# Patient Record
Sex: Male | Born: 1980 | Race: Black or African American | Hispanic: No | Marital: Single | Smoking: Current every day smoker
Health system: Southern US, Community
[De-identification: ages and names within clinical notes are randomized; demographics above are authoritative.]

## PROBLEM LIST (undated history)

## (undated) DIAGNOSIS — I10 Essential (primary) hypertension: Secondary | ICD-10-CM

## (undated) HISTORY — PX: FRACTURE SURGERY: SHX138

---

## 2005-11-03 ENCOUNTER — Emergency Department: Payer: Self-pay | Admitting: Internal Medicine

## 2008-07-25 ENCOUNTER — Emergency Department: Payer: Self-pay | Admitting: Emergency Medicine

## 2008-08-02 ENCOUNTER — Emergency Department: Payer: Self-pay | Admitting: Internal Medicine

## 2015-07-03 ENCOUNTER — Emergency Department
Admission: EM | Admit: 2015-07-03 | Discharge: 2015-07-03 | Disposition: A | Discharge status: Home / Self Care | Payer: BLUE CROSS/BLUE SHIELD | Attending: Emergency Medicine | Admitting: Emergency Medicine

## 2015-07-03 ENCOUNTER — Emergency Department: Payer: BLUE CROSS/BLUE SHIELD

## 2015-07-03 ENCOUNTER — Encounter: Payer: Self-pay | Admitting: Emergency Medicine

## 2015-07-03 DIAGNOSIS — F17210 Nicotine dependence, cigarettes, uncomplicated: Secondary | ICD-10-CM | POA: Insufficient documentation

## 2015-07-03 DIAGNOSIS — M545 Low back pain, unspecified: Secondary | ICD-10-CM

## 2015-07-03 MED ORDER — DIAZEPAM 5 MG PO TABS
5.0000 mg | ORAL_TABLET | Freq: Once | ORAL | Status: AC
  Administered 2015-07-03: 5 mg via ORAL
  Filled 2015-07-03: qty 1

## 2015-07-03 MED ORDER — DIAZEPAM 2 MG PO TABS: 2.0000 mg | ORAL_TABLET | Freq: Three times a day (TID) | ORAL | Status: AC | PRN

## 2015-07-03 MED ORDER — PREDNISONE 10 MG (21) PO TBPK: ORAL_TABLET | ORAL | Status: DC

## 2015-07-03 MED ORDER — KETOROLAC TROMETHAMINE 10 MG PO TABS: 10.0000 mg | ORAL_TABLET | Freq: Four times a day (QID) | ORAL | Status: DC | PRN

## 2015-07-03 MED ORDER — ORPHENADRINE CITRATE 30 MG/ML IJ SOLN: 60.0000 mg | Freq: Two times a day (BID) | INTRAMUSCULAR | Status: DC

## 2015-07-03 MED ORDER — KETOROLAC TROMETHAMINE 60 MG/2ML IM SOLN
60.0000 mg | Freq: Once | INTRAMUSCULAR | Status: AC
  Administered 2015-07-03: 60 mg via INTRAMUSCULAR
  Filled 2015-07-03: qty 2

## 2015-07-03 NOTE — Discharge Instructions (Signed)

## 2015-07-03 NOTE — ED Provider Notes (Signed)
[facility] Emergency Department Provider Note ____________________________________________  Time seen: Approximately 8:26 PM  I have reviewed the triage vital signs and the nursing notes.   HISTORY  Chief Complaint Back Pain   HPI Danny Weeks is a 34 y.o. male who presents to the emergency department for evaluation of lower back pain. Pain started about a month ago during a football game. Over the past 2 weeks, the pain has gone from intermittent to continuous. No relief with muscle relaxants, ibuprofen, or Norco. No history of back injury prior to this. No loss of bowel or bladder control. No saddle paresthesia.  History reviewed. No pertinent past medical history.  There are no active problems to display for this patient.   History reviewed. No pertinent past surgical history.  Current Outpatient Rx  Name  Route  Sig  Dispense  Refill  . diazepam (VALIUM) 2 MG tablet   Oral   Take 1 tablet (2 mg total) by mouth every 8 (eight) hours as needed for anxiety. DO NOT TAKE AND DRIVE MACHINERY   12 tablet   0   . ketorolac (TORADOL) 10 MG tablet   Oral   Take 1 tablet (10 mg total) by mouth every 6 (six) hours as needed.   20 tablet   0   . predniSONE (STERAPRED UNI-PAK 21 TAB) 10 MG (21) TBPK tablet      Take 6 tablets on day 1 Take 5 tablets on day 2 Take 4 tablets on day 3 Take 3 tablets on day 4 Take 2 tablets on day 5 Take 1 tablet on day 6   21 tablet   0     Allergies Review of patient's allergies indicates no known allergies.  No family history on file.  Social History Social History  Substance Use Topics  . Smoking status: Current Every Day Smoker    Types: Cigarettes  . Smokeless tobacco: Never Used  . Alcohol Use: Yes    Review of Systems Constitutional: No recent illness. Eyes: No visual changes. ENT: No sore throat. Cardiovascular: Denies chest pain or palpitations. Respiratory: Denies shortness of  breath. Gastrointestinal: No abdominal pain.  Genitourinary: Negative for dysuria. Musculoskeletal: Pain in lower back with radiation into the left buttock. Skin: Negative for rash. Neurological: Negative for headaches, focal weakness or numbness. 10-point ROS otherwise negative.  ____________________________________________   PHYSICAL EXAM:  VITAL SIGNS: ED Triage Vitals  Enc Vitals Group     BP 07/03/15 2008 175/112 mmHg     Pulse Rate 07/03/15 2008 97     Resp 07/03/15 2008 16     Temp 07/03/15 2008 98.2 F (36.8 C)     Temp Source 07/03/15 2008 Oral     SpO2 07/03/15 2008 97 %     Weight 07/03/15 2008 270 lb (122.471 kg)     Height 07/03/15 2008 6\' 3"  (1.905 m)     Head Cir --      Peak Flow --      Pain Score 07/03/15 2008 9     Pain Loc --      Pain Edu? --      Excl. in GC? --     Constitutional: Alert and oriented. Well appearing and in no acute distress. Eyes: Conjunctivae are normal. EOMI. Head: Atraumatic. Nose: No congestion/rhinnorhea. Neck: No stridor.  Respiratory: Normal respiratory effort.   Musculoskeletal: Limited flexion from the waist due to pain.  Neurologic:  Normal speech and language. No gross focal neurologic  deficits are appreciated. Speech is normal. No gait instability. Skin:  Skin is warm, dry and intact. Atraumatic. Psychiatric: Mood and affect are normal. Speech and behavior are normal.  ____________________________________________   LABS (all labs ordered are listed, but only abnormal results are displayed)  Labs Reviewed - No data to display ____________________________________________  RADIOLOGY  Lumbar spine film negative for acute abnormality per radiology. ____________________________________________   PROCEDURES  Procedure(s) performed:    ____________________________________________   INITIAL IMPRESSION / ASSESSMENT AND PLAN / ED COURSE  Pertinent labs & imaging results that were available during my care of  the patient were reviewed by me and considered in my medical decision making (see chart for details).  Patient was given Toradol IM and Valium by mouth while in the emergency department with moderate relief. He will be prescribed both and advised to follow-up with orthopedics for symptoms that are not improving over the next few days. He was advised to return to the emergency department for symptoms that change or worsen if he is unable to schedule an appointment. ____________________________________________   FINAL CLINICAL IMPRESSION(S) / ED DIAGNOSES  Final diagnoses:  Acute lumbar back pain       Chinita Pester, FNP 07/03/15 2307  Emily Filbert, MD 07/03/15 [phone]

## 2015-07-03 NOTE — ED Notes (Signed)
Pt presents with low back pain for two weeks.  

## 2016-10-01 ENCOUNTER — Encounter: Payer: Self-pay | Admitting: *Deleted

## 2016-10-01 ENCOUNTER — Emergency Department
Admission: EM | Admit: 2016-10-01 | Discharge: 2016-10-01 | Disposition: A | Discharge status: Home / Self Care | Payer: BLUE CROSS/BLUE SHIELD | Attending: Emergency Medicine | Admitting: Emergency Medicine

## 2016-10-01 DIAGNOSIS — F17210 Nicotine dependence, cigarettes, uncomplicated: Secondary | ICD-10-CM | POA: Insufficient documentation

## 2016-10-01 DIAGNOSIS — M545 Low back pain, unspecified: Secondary | ICD-10-CM

## 2016-10-01 DIAGNOSIS — X501XXA Overexertion from prolonged static or awkward postures, initial encounter: Secondary | ICD-10-CM | POA: Insufficient documentation

## 2016-10-01 DIAGNOSIS — Y999 Unspecified external cause status: Secondary | ICD-10-CM | POA: Insufficient documentation

## 2016-10-01 DIAGNOSIS — Y929 Unspecified place or not applicable: Secondary | ICD-10-CM | POA: Insufficient documentation

## 2016-10-01 DIAGNOSIS — Y9389 Activity, other specified: Secondary | ICD-10-CM | POA: Insufficient documentation

## 2016-10-01 MED ORDER — ORPHENADRINE CITRATE 30 MG/ML IJ SOLN
60.0000 mg | Freq: Two times a day (BID) | INTRAMUSCULAR | Status: DC
  Administered 2016-10-01: 60 mg via INTRAMUSCULAR
  Filled 2016-10-01: qty 2

## 2016-10-01 MED ORDER — CYCLOBENZAPRINE HCL 5 MG PO TABS
5.0000 mg | ORAL_TABLET | Freq: Three times a day (TID) | ORAL | 0 refills | Status: AC | PRN
Start: 2016-10-01 — End: 2016-10-04

## 2016-10-01 NOTE — ED Triage Notes (Signed)
Pt stood up from toilet felt pull in lower back, pt denies any other  symptoms

## 2016-10-01 NOTE — ED Provider Notes (Signed)
[facility] Emergency Department Provider Note  ____________________________________________  Time seen: Approximately 9:05 PM  I have reviewed the triage vital signs and the nursing notes.   HISTORY  Chief Complaint Back Pain    HPI Danny Weeks is a 36 y.o. male presenting to the emergency department with low back pain. Patient states that he "stood up from the toilet" and felt a pop. Patient characterizes low back pain at 8 out of 10 in intensity and aching. He denies radiculopathy, weakness or bowel or bladder incontinence. Patient has taken ibuprofen but has attempted no other alleviating measures. He denies dysuria, hematuria and bilateral flank pain.   History reviewed. No pertinent past medical history.  There are no active problems to display for this patient.   History reviewed. No pertinent surgical history.  Prior to Admission medications   Medication Sig Start Date End Date Taking? Authorizing Provider  ketorolac (TORADOL) 10 MG tablet Take 1 tablet (10 mg total) by mouth every 6 (six) hours as needed. 07/03/15   Chinita Pesterari B Triplett, FNP  predniSONE (STERAPRED UNI-PAK 21 TAB) 10 MG (21) TBPK tablet Take 6 tablets on day 1 Take 5 tablets on day 2 Take 4 tablets on day 3 Take 3 tablets on day 4 Take 2 tablets on day 5 Take 1 tablet on day 6 07/03/15   Chinita Pesterari B Triplett, FNP    Allergies Patient has no known allergies.  No family history on file.  Social History Social History  Substance Use Topics  . Smoking status: Current Every Day Smoker    Packs/day: 1.00    Types: Cigarettes  . Smokeless tobacco: Never Used  . Alcohol use Yes    Review of Systems  Constitutional: No fever/chills Eyes: No visual changes. No discharge ENT: No upper respiratory complaints. Cardiovascular: no chest pain. Respiratory: no cough. No SOB. Gastrointestinal: No abdominal pain. No nausea, no vomiting.  No diarrhea.  No constipation. Genitourinary:  Negative for dysuria. No hematuria Musculoskeletal: Patient has low back pain.  Skin: Negative for rash, abrasions, lacerations, ecchymosis. Neurological: Negative for headaches, focal weakness or numbness. ____________________________________________   PHYSICAL EXAM:  VITAL SIGNS: ED Triage Vitals  Enc Vitals Group     BP 10/01/16 1842 (!) 170/88     Pulse Rate 10/01/16 1842 100     Resp 10/01/16 1842 18     Temp 10/01/16 1842 97.5 F (36.4 C)     Temp src --      SpO2 10/01/16 1842 98 %     Weight 10/01/16 1841 273 lb (123.8 kg)     Height 10/01/16 1841 6\' 3"  (1.905 m)     Head Circumference --      Peak Flow --      Pain Score 10/01/16 1841 8     Pain Loc --      Pain Edu? --      Excl. in GC? --      Constitutional: Alert and oriented. Well appearing and in no acute distress. Eyes: Conjunctivae are normal. PERRL. EOMI. Head: Atraumatic.  Neck: Full range of motion. No pain was elicited with palpation of the cervical spine. No radiculopathy was elicited with range of motion testing.  Cardiovascular: Normal rate, regular rhythm. Normal S1 and S2.  Good peripheral circulation. Respiratory: Normal respiratory effort without tachypnea or retractions. Lungs CTAB. Good air entry to the bases with no decreased or absent breath sounds. Musculoskeletal: Patient has 5/5 strength in the upper and lower extremities  bilaterally. Full range of motion at the shoulder, elbow and wrist bilaterally. Full range of motion at the hip, knee and ankle bilaterally. No changes in gait.  Patient's low back pain is intensified with  extension at the spine. Negative straight leg raise test bilaterally. No pain was elicited with palpation along the lumbar and thoracic spine regions. Possible radial and ulnar pulses bilaterally and symmetrically. Neurologic:  Normal speech and language. No gross focal neurologic deficits are appreciated. Reflexes are 2+ and symmetric in the upper and lower extremities  bilaterally. Skin:  Skin is warm, dry and intact. No rash noted. Psychiatric: Mood and affect are normal. Speech and behavior are normal. Patient exhibits appropriate insight and judgement. ____________________________________________   LABS (all labs ordered are listed, but only abnormal results are displayed)  Labs Reviewed - No data to display ____________________________________________  EKG   ____________________________________________  RADIOLOGY   No results found.  ____________________________________________    PROCEDURES  Procedure(s) performed:    Procedures    Medications - No data to display   ____________________________________________   INITIAL IMPRESSION / ASSESSMENT AND PLAN / ED COURSE  Pertinent labs & imaging results that were available during my care of the patient were reviewed by me and considered in my medical decision making (see chart for details).  Review of the [place] CSRS was performed in accordance of the [facility] prior to dispensing any controlled drugs.     Assessment and Plan:  Low Back Pain  Patient presents to the emergency department with low back pain. Patient denies radiculopathy, weakness or bowel or bladder incontinence. An injection of Norflex was given in the emergency department. Patient was discharged with Flexeril. Physical exam is reassuring at this time. Patient was advised to follow-up with his primary care provider as needed for low back pain. All patient questions were answered.     ____________________________________________  FINAL CLINICAL IMPRESSION(S) / ED DIAGNOSES  Final diagnoses:  None      NEW MEDICATIONS STARTED DURING THIS VISIT:  New Prescriptions   No medications on file        This chart was dictated using voice recognition software/Dragon. Despite best efforts to proofread, errors can occur which can change the meaning. Any change was purely unintentional.    Orvil Feil,  PA-C 10/02/16 0025    Orvil Feil, PA-C 10/02/16 0025    Merrily Brittle, MD 10/02/16 1252

## 2017-06-20 ENCOUNTER — Encounter: Payer: Self-pay | Admitting: Radiology

## 2017-06-20 ENCOUNTER — Emergency Department: Payer: Self-pay

## 2017-06-20 ENCOUNTER — Other Ambulatory Visit: Payer: Self-pay

## 2017-06-20 ENCOUNTER — Observation Stay
Admission: EM | Admit: 2017-06-20 | Discharge: 2017-06-23 | Disposition: A | Discharge status: Home / Self Care | Payer: Self-pay | Attending: Internal Medicine | Admitting: Internal Medicine

## 2017-06-20 DIAGNOSIS — Z79899 Other long term (current) drug therapy: Secondary | ICD-10-CM | POA: Insufficient documentation

## 2017-06-20 DIAGNOSIS — I10 Essential (primary) hypertension: Secondary | ICD-10-CM | POA: Insufficient documentation

## 2017-06-20 DIAGNOSIS — R000 Tachycardia, unspecified: Secondary | ICD-10-CM | POA: Insufficient documentation

## 2017-06-20 DIAGNOSIS — R509 Fever, unspecified: Secondary | ICD-10-CM | POA: Insufficient documentation

## 2017-06-20 DIAGNOSIS — F17210 Nicotine dependence, cigarettes, uncomplicated: Secondary | ICD-10-CM | POA: Insufficient documentation

## 2017-06-20 DIAGNOSIS — R197 Diarrhea, unspecified: Secondary | ICD-10-CM | POA: Insufficient documentation

## 2017-06-20 DIAGNOSIS — E860 Dehydration: Secondary | ICD-10-CM | POA: Insufficient documentation

## 2017-06-20 DIAGNOSIS — R1030 Lower abdominal pain, unspecified: Secondary | ICD-10-CM | POA: Insufficient documentation

## 2017-06-20 DIAGNOSIS — A021 Salmonella sepsis: Principal | ICD-10-CM | POA: Insufficient documentation

## 2017-06-20 DIAGNOSIS — E876 Hypokalemia: Secondary | ICD-10-CM | POA: Insufficient documentation

## 2017-06-20 DIAGNOSIS — R109 Unspecified abdominal pain: Secondary | ICD-10-CM

## 2017-06-20 DIAGNOSIS — R112 Nausea with vomiting, unspecified: Secondary | ICD-10-CM

## 2017-06-20 DIAGNOSIS — K219 Gastro-esophageal reflux disease without esophagitis: Secondary | ICD-10-CM | POA: Insufficient documentation

## 2017-06-20 LAB — CBC
HEMATOCRIT: 53.8 % — AB (ref 40.0–52.0)
Hemoglobin: 17.9 g/dL (ref 13.0–18.0)
MCH: 29.7 pg (ref 26.0–34.0)
MCHC: 33.2 g/dL (ref 32.0–36.0)
MCV: 89.3 fL (ref 80.0–100.0)
Platelets: 298 10*3/uL (ref 150–440)
RBC: 6.02 MIL/uL — ABNORMAL HIGH (ref 4.40–5.90)
RDW: 14.3 % (ref 11.5–14.5)
WBC: 14.4 10*3/uL — ABNORMAL HIGH (ref 3.8–10.6)

## 2017-06-20 LAB — GASTROINTESTINAL PANEL BY PCR, STOOL (REPLACES STOOL CULTURE)
ADENOVIRUS F40/41: NOT DETECTED
ASTROVIRUS: NOT DETECTED
CYCLOSPORA CAYETANENSIS: NOT DETECTED
Campylobacter species: NOT DETECTED
Cryptosporidium: NOT DETECTED
ENTAMOEBA HISTOLYTICA: NOT DETECTED
ENTEROAGGREGATIVE E COLI (EAEC): NOT DETECTED
ENTEROTOXIGENIC E COLI (ETEC): NOT DETECTED
Enteropathogenic E coli (EPEC): NOT DETECTED
GIARDIA LAMBLIA: NOT DETECTED
NOROVIRUS GI/GII: NOT DETECTED
Plesimonas shigelloides: NOT DETECTED
Rotavirus A: NOT DETECTED
Salmonella species: DETECTED — AB
Sapovirus (I, II, IV, and V): NOT DETECTED
Shiga like toxin producing E coli (STEC): NOT DETECTED
Shigella/Enteroinvasive E coli (EIEC): NOT DETECTED
VIBRIO CHOLERAE: NOT DETECTED
VIBRIO SPECIES: NOT DETECTED
Yersinia enterocolitica: NOT DETECTED

## 2017-06-20 LAB — COMPREHENSIVE METABOLIC PANEL
ALT: 56 U/L (ref 17–63)
ANION GAP: 13 (ref 5–15)
AST: 41 U/L (ref 15–41)
Albumin: 4.7 g/dL (ref 3.5–5.0)
Alkaline Phosphatase: 59 U/L (ref 38–126)
BUN: 18 mg/dL (ref 6–20)
CHLORIDE: 97 mmol/L — AB (ref 101–111)
CO2: 24 mmol/L (ref 22–32)
Calcium: 9.8 mg/dL (ref 8.9–10.3)
Creatinine, Ser: 1.65 mg/dL — ABNORMAL HIGH (ref 0.61–1.24)
GFR, EST NON AFRICAN AMERICAN: 52 mL/min — AB (ref >60)
Glucose, Bld: 137 mg/dL — ABNORMAL HIGH (ref 65–99)
POTASSIUM: 3.5 mmol/L (ref 3.5–5.1)
Sodium: 134 mmol/L — ABNORMAL LOW (ref 135–145)
TOTAL PROTEIN: 9.2 g/dL — AB (ref 6.5–8.1)
Total Bilirubin: 1.5 mg/dL — ABNORMAL HIGH (ref 0.3–1.2)

## 2017-06-20 LAB — LACTIC ACID, PLASMA
LACTIC ACID, VENOUS: 2.5 mmol/L — AB (ref 0.5–1.9)
Lactic Acid, Venous: 1.3 mmol/L (ref 0.5–1.9)

## 2017-06-20 LAB — LIPASE, BLOOD: LIPASE: 18 U/L (ref 11–51)

## 2017-06-20 MED ORDER — CLONIDINE HCL 0.2 MG/24HR TD PTWK
0.2000 mg | MEDICATED_PATCH | TRANSDERMAL | Status: DC
  Administered 2017-06-20: 0.2 mg via TRANSDERMAL
  Filled 2017-06-20: qty 1

## 2017-06-20 MED ORDER — ACETAMINOPHEN 325 MG PO TABS
650.0000 mg | ORAL_TABLET | Freq: Four times a day (QID) | ORAL | Status: DC | PRN
  Administered 2017-06-20 – 2017-06-21 (×3): 650 mg via ORAL
  Filled 2017-06-20 (×3): qty 2

## 2017-06-20 MED ORDER — ONDANSETRON HCL 4 MG PO TABS: 4.0000 mg | ORAL_TABLET | Freq: Four times a day (QID) | ORAL | Status: DC | PRN

## 2017-06-20 MED ORDER — SODIUM CHLORIDE 0.9 % IV BOLUS (SEPSIS)
1000.0000 mL | Freq: Once | INTRAVENOUS | Status: AC
  Administered 2017-06-20: 1000 mL via INTRAVENOUS

## 2017-06-20 MED ORDER — TRAMADOL HCL 50 MG PO TABS
50.0000 mg | ORAL_TABLET | Freq: Four times a day (QID) | ORAL | Status: DC | PRN
  Administered 2017-06-22: 50 mg via ORAL
  Filled 2017-06-20: qty 1

## 2017-06-20 MED ORDER — POTASSIUM CHLORIDE IN NACL 20-0.9 MEQ/L-% IV SOLN
INTRAVENOUS | Status: DC
  Administered 2017-06-20 – 2017-06-23 (×7): via INTRAVENOUS
  Filled 2017-06-20 (×11): qty 1000

## 2017-06-20 MED ORDER — HYDRALAZINE HCL 20 MG/ML IJ SOLN
10.0000 mg | INTRAMUSCULAR | Status: DC | PRN
  Administered 2017-06-20 – 2017-06-21 (×2): 10 mg via INTRAVENOUS
  Filled 2017-06-20 (×2): qty 1

## 2017-06-20 MED ORDER — HYDROMORPHONE HCL 1 MG/ML IJ SOLN
1.0000 mg | INTRAMUSCULAR | Status: DC | PRN
  Administered 2017-06-20 – 2017-06-22 (×12): 1 mg via INTRAVENOUS
  Filled 2017-06-20 (×12): qty 1

## 2017-06-20 MED ORDER — METRONIDAZOLE IN NACL 5-0.79 MG/ML-% IV SOLN
500.0000 mg | Freq: Three times a day (TID) | INTRAVENOUS | Status: DC
  Administered 2017-06-20 – 2017-06-21 (×3): 500 mg via INTRAVENOUS
  Filled 2017-06-20 (×4): qty 100

## 2017-06-20 MED ORDER — MORPHINE SULFATE (PF) 4 MG/ML IV SOLN
4.0000 mg | Freq: Once | INTRAVENOUS | Status: AC
  Administered 2017-06-20: 4 mg via INTRAVENOUS
  Filled 2017-06-20: qty 1

## 2017-06-20 MED ORDER — HEPARIN SODIUM (PORCINE) 5000 UNIT/ML IJ SOLN
5000.0000 [IU] | Freq: Three times a day (TID) | INTRAMUSCULAR | Status: DC
  Administered 2017-06-20 – 2017-06-23 (×8): 5000 [IU] via SUBCUTANEOUS
  Filled 2017-06-20 (×8): qty 1

## 2017-06-20 MED ORDER — PB-HYOSCY-ATROPINE-SCOPOLAMINE 16.2 MG/5ML PO ELIX
10.0000 mL | ORAL_SOLUTION | Freq: Once | ORAL | Status: AC
  Administered 2017-06-20: 32.4 mg via ORAL
  Filled 2017-06-20: qty 10

## 2017-06-20 MED ORDER — KETOROLAC TROMETHAMINE 30 MG/ML IJ SOLN
30.0000 mg | Freq: Once | INTRAMUSCULAR | Status: AC
  Administered 2017-06-20: 30 mg via INTRAVENOUS
  Filled 2017-06-20: qty 1

## 2017-06-20 MED ORDER — ONDANSETRON HCL 4 MG/2ML IJ SOLN
4.0000 mg | Freq: Once | INTRAMUSCULAR | Status: AC
  Administered 2017-06-20: 4 mg via INTRAVENOUS
  Filled 2017-06-20: qty 2

## 2017-06-20 MED ORDER — MORPHINE SULFATE (PF) 2 MG/ML IV SOLN: 2.0000 mg | INTRAVENOUS | Status: DC | PRN

## 2017-06-20 MED ORDER — ONDANSETRON HCL 4 MG/2ML IJ SOLN
4.0000 mg | Freq: Four times a day (QID) | INTRAMUSCULAR | Status: DC | PRN
  Administered 2017-06-20: 4 mg via INTRAVENOUS
  Filled 2017-06-20: qty 2

## 2017-06-20 MED ORDER — ACETAMINOPHEN 650 MG RE SUPP: 650.0000 mg | Freq: Four times a day (QID) | RECTAL | Status: DC | PRN

## 2017-06-20 MED ORDER — IOPAMIDOL (ISOVUE-300) INJECTION 61%
100.0000 mL | Freq: Once | INTRAVENOUS | Status: AC | PRN
  Administered 2017-06-20: 100 mL via INTRAVENOUS

## 2017-06-20 MED ORDER — IOPAMIDOL (ISOVUE-300) INJECTION 61%
30.0000 mL | Freq: Once | INTRAVENOUS | Status: AC | PRN
  Administered 2017-06-20: 30 mL via ORAL

## 2017-06-20 MED ORDER — BISACODYL 10 MG RE SUPP: 10.0000 mg | Freq: Every day | RECTAL | Status: DC | PRN

## 2017-06-20 MED ORDER — CIPROFLOXACIN IN D5W 400 MG/200ML IV SOLN
400.0000 mg | Freq: Two times a day (BID) | INTRAVENOUS | Status: DC
  Administered 2017-06-20 – 2017-06-23 (×6): 400 mg via INTRAVENOUS
  Filled 2017-06-20 (×7): qty 200

## 2017-06-20 MED ORDER — PANTOPRAZOLE SODIUM 40 MG IV SOLR
40.0000 mg | Freq: Two times a day (BID) | INTRAVENOUS | Status: DC
Start: 2017-06-20 — End: 2017-06-23
  Administered 2017-06-20 – 2017-06-22 (×5): 40 mg via INTRAVENOUS
  Filled 2017-06-20 (×6): qty 40

## 2017-06-20 NOTE — ED Provider Notes (Addendum)
[facility] Emergency Department Provider Note ____________________________________________   First MD Initiated Contact with Patient 06/20/17 0945     (approximate)  I have reviewed the triage vital signs and the nursing notes.   HISTORY  Chief Complaint Abdominal Pain    HPI Danny Weeks is a 36 y.o. male with no significant past medical history who presents with lower abdominal crampy pain for the last day, associated with multiple episodes of nonbloody vomiting and nonbloody diarrhea, and generalized malaise.  Patient states that the pain comes in waves and is not constant.  Patient states that the symptoms started yesterday after eating smoked [place] the night before, and he thinks that the [place] was bad.  No sick contacts or other recent illness.  No recent travel or antibiotic use.   History reviewed. No pertinent past medical history.  There are no active problems to display for this patient.   No past surgical history on file.  Prior to Admission medications   Medication Sig Start Date End Date Taking? Authorizing Provider  ketorolac (TORADOL) 10 MG tablet Take 1 tablet (10 mg total) by mouth every 6 (six) hours as needed. 07/03/15   Triplett, Cari B, FNP  predniSONE (STERAPRED UNI-PAK 21 TAB) 10 MG (21) TBPK tablet Take 6 tablets on day 1 Take 5 tablets on day 2 Take 4 tablets on day 3 Take 3 tablets on day 4 Take 2 tablets on day 5 Take 1 tablet on day 6 07/03/15   Kem Boroughsriplett, Cari B, FNP    Allergies Patient has no known allergies.  No family history on file.  Social History Social History   Tobacco Use  . Smoking status: Current Every Day Smoker    Packs/day: 1.00    Types: Cigarettes  . Smokeless tobacco: Never Used  Substance Use Topics  . Alcohol use: Yes  . Drug use: No    Review of Systems  Constitutional: Positive for chills. Eyes: No icterus. ENT: No sore throat. Cardiovascular: Denies chest pain. Respiratory:  Denies shortness of breath. Gastrointestinal: Positive for nausea, vomiting, and diarrhea.  Genitourinary: Negative for dysuria.  Musculoskeletal: Negative for back pain. Skin: Negative for rash. Neurological: Negative for headache.    ____________________________________________   PHYSICAL EXAM:  VITAL SIGNS: ED Triage Vitals [06/20/17 0915]  Enc Vitals Group     BP      Pulse Rate (!) 138     Resp 20     Temp 99.9 F (37.7 C)     Temp src      SpO2 97 %     Weight 280 lb (127 kg)     Height 6\' 2"  (1.88 m)     Head Circumference      Peak Flow      Pain Score 10     Pain Loc      Pain Edu?      Excl. in GC?     Constitutional: Alert and oriented.  Slightly uncomfortable appearing but in no acute distress. Eyes: Conjunctivae are normal.  No scleral icterus  Head: Atraumatic. Nose: No congestion/rhinnorhea. Mouth/Throat: Mucous membranes are slightly dry.   Neck: Normal range of motion.  Cardiovascular:   Good peripheral circulation. Respiratory: Normal respiratory effort.  No retractions. Gastrointestinal: Soft and nontender. No distention.  Genitourinary: No flank tenderness. Musculoskeletal: Extremities warm and well perfused.  Neurologic:  Normal speech and language. No gross focal neurologic deficits are appreciated.  Skin:  Skin is warm and dry. No  rash noted. Psychiatric: Mood and affect are normal. Speech and behavior are normal.  ____________________________________________   LABS (all labs ordered are listed, but only abnormal results are displayed)  Labs Reviewed  COMPREHENSIVE METABOLIC PANEL - Abnormal; Notable for the following components:      Result Value   Sodium 134 (*)    Chloride 97 (*)    Glucose, Bld 137 (*)    Creatinine, Ser 1.65 (*)    Total Protein 9.2 (*)    Total Bilirubin 1.5 (*)    GFR calc non Af Amer 52 (*)    All other components within normal limits  CBC - Abnormal; Notable for the following components:   WBC 14.4 (*)      RBC 6.02 (*)    HCT 53.8 (*)    All other components within normal limits  GASTROINTESTINAL PANEL BY PCR, STOOL (REPLACES STOOL CULTURE)  CULTURE, BLOOD (ROUTINE X 2)  CULTURE, BLOOD (ROUTINE X 2)  LIPASE, BLOOD  URINALYSIS, COMPLETE (UACMP) WITH MICROSCOPIC  LACTIC ACID, PLASMA  LACTIC ACID, PLASMA   ____________________________________________  EKG  ED ECG REPORT I, Dionne BucySebastian Hazelynn Mckenny, the attending physician, personally viewed and interpreted this ECG.  Date: 06/20/2017 EKG Time: 1431 Rate: 136 Rhythm: sinus tachycardia QRS Axis: normal Intervals: normal ST/T Wave abnormalities: normal Narrative Interpretation: no evidence of acute ischemia  ____________________________________________  RADIOLOGY   CT abd; hepatic steatosis, no other acute findings ____________________________________________   PROCEDURES  Procedure(s) performed: No    Critical Care performed: No ____________________________________________   INITIAL IMPRESSION / ASSESSMENT AND PLAN / ED COURSE  Pertinent labs & imaging results that were available during my care of the patient were reviewed by me and considered in my medical decision making (see chart for details).  36 year old male with no significant past medical history presents with 1 day of nausea, vomiting, diarrhea, and lower abdominal crampy intermittent pain.  On exam, he has a borderline temperature and he is tachycardic but other vital signs are normal and the remainder the exam is relatively unremarkable.  Patient's abdomen is soft and nontender.  Review of past medical records in Epic is noncontributory.  Differential includes foodborne illness, viral gastroenteritis, less likely colitis or diverticulitis given that he has no focal tenderness or constant pain.  No indication for imaging based on initial exam. Plan: Basic and hepatobiliary labs, fluids, analgesia, antiemetic and reassess.  If concerning lab findings or  persistent or worsening pain, consider imaging.    ----------------------------------------- 11:43 AM on 06/20/2017 -----------------------------------------  Patient reports persistent pain and is now slightly tender in the lower belly.  His white count is elevated.  I will give additional analgesia and fluids, and obtain a CT.  ----------------------------------------- 1:46 PM on 06/20/2017 -----------------------------------------  On reassessment, patient reports persistent intermittent bouts of severe pain, although he is pain-free in between.  His abdominal exam remains benign.  He is still significantly tachycardic, to 120s-130s.  The CT does not show acute findings except for hepatic steatosis.  Clinically the presentation is still consistent with gastroenteritis versus mild colitis.  We will continue to give fluids and observe.  If patient remains persistently tachycardic or has persistent severe pain may require admission.  ----------------------------------------- 2:37 PM on 06/20/2017 -----------------------------------------  Patient does have persistently relatively intense pain, although he still reports that it is intermittent.  He is tachycardic to the 130s-140s after 2 L.  He is also relatively hypertensive.  His EKG shows no concerning findings other than the tachycardia.  Given  patient's abnormal vital signs and persistent symptoms I do not believe it is safe for him to go home at this time.  We will continue fluids, and patient is also given a sample for stool studies and will obtain blood cultures and lactate.  Admission discussed with Dr. Judithann Sheen, the hospitalist.  ____________________________________________   FINAL CLINICAL IMPRESSION(S) / ED DIAGNOSES  Final diagnoses:  Nausea vomiting and diarrhea  Lower abdominal pain  Tachycardia  Dehydration      NEW MEDICATIONS STARTED DURING THIS VISIT:  This SmartLink is deprecated. Use AVSMEDLIST instead to  display the medication list for a patient.   Note:  This document was prepared using Dragon voice recognition software and may include unintentional dictation errors.    Dionne Bucy, MD 06/20/17 1440    Dionne Bucy, MD 06/20/17 1441    Dionne Bucy, MD 06/20/17 [phone]

## 2017-06-20 NOTE — ED Notes (Signed)
Reported to Dr. Judithann SheenSparks lab reported patient is positive for Salmonella.

## 2017-06-20 NOTE — ED Notes (Signed)
Notified Dr. Judithann SheenSparks of Critical Result for Lactic Acid of 2.5

## 2017-06-20 NOTE — ED Notes (Addendum)
Date and time results received: 06/20/17 1555 (use smartphrase ".now" to insert current time)  Test: Lactic Acid Critical Value: 2.5  Name of Provider Notified: Dr. Judithann SheenSparks  Orders Received? Or Actions Taken?: Actions Taken: Notified

## 2017-06-20 NOTE — ED Notes (Signed)
Pt given urine cup for urine specimen. 

## 2017-06-20 NOTE — Progress Notes (Signed)
Called to get report from Ray,Rn. RN states he would call back.

## 2017-06-20 NOTE — ED Triage Notes (Signed)
Patient c/o of abdominal cramping, nausea, vomiting, diarrhea since 06/19/17 AM. Patient states 'I think I got ahold of some bad meat"

## 2017-06-20 NOTE — ED Notes (Signed)
Pain reported by patient to ED provider

## 2017-06-20 NOTE — H&P (Signed)
History and Physical    Danny Weeks  Referring physician: Dr. Marisa SeverinSiadecki PCP: Patient, No Pcp Per  Specialists: none  Chief Complaint: abdominal pain with N/V/D  HPI: Danny Weeks is a 36 y.o. male with no significant past medical history  Who presents to ER with 2 day hx of worsening lower abdominal pin associated with N/V/D. Sx's began after eating some "bad [place]key". In Er, pt is febrile with leukocytosis. Also noted to be tachycardic and hypertensive. No improvement in ER with IV morphine and IV fluids. CT non-diagnostic. He is now admitted.  Review of Systems: The patient denies anorexia, fever, weight loss,, vision loss, decreased hearing, hoarseness, chest pain, syncope, dyspnea on exertion, peripheral edema, balance deficits, hemoptysis,  melena, hematochezia, severe indigestion/heartburn, hematuria, incontinence, genital sores, muscle weakness, suspicious skin lesions, transient blindness, difficulty walking, depression, unusual weight change, abnormal bleeding, enlarged lymph nodes, angioedema, and breast masses.   History reviewed. No pertinent past medical history. History reviewed. No pertinent surgical history. Social History:  reports that he has been smoking cigarettes.  He has been smoking about 1.00 pack per day. he has never used smokeless tobacco. He reports that he drinks alcohol. He reports that he does not use drugs.  No Known Allergies  History reviewed. No pertinent family history.  Prior to Admission medications   Medication Sig Start Date End Date Taking? Authorizing Provider  ketorolac (TORADOL) 10 MG tablet Take 1 tablet (10 mg total) by mouth every 6 (six) hours as needed. 07/03/15   Triplett, Cari B, FNP  predniSONE (STERAPRED UNI-PAK 21 TAB) 10 MG (21) TBPK tablet Take 6 tablets on day 1 Take 5 tablets on day 2 Take 4 tablets on day 3 Take 3 tablets on day 4 Take 2 tablets on day 5 Take 1 tablet on day 6  07/03/15   Chinita Pesterriplett, Cari B, FNP   Physical Exam: Vitals:   06/20/17 1300 06/20/17 1330 06/20/17 1346 06/20/17 1400  BP: (!) 169/115 (!) 179/114  (!) 182/130  Pulse: (!) 121     Resp:      Temp:   99.6 F (37.6 C)   TempSrc:   Oral   SpO2: 94%     Weight:      Height:         General:  WDWN, Yeager/AT, in moderate distress  Eyes: PERRL, EOMI, no scleral icterus, conjunctiva clear  ENT: moist oropharynx without exudate, TM's benign, dentition good  Neck: supple, no lymphadenopathy. No bruits or thyromegaly  Cardiovascular: rapid rate with regular rhythm without MRG; 2+ peripheral pulses, no JVD, no peripheral edema  Respiratory: CTA biL, good air movement without wheezing, rhonchi or crackled. Respiratory effort normal  Abdomen: soft, mildly tender in lower quadrants to palpation, positive bowel sounds, no guarding, no rebound  Skin: no rashes or lesions  Musculoskeletal: normal bulk and tone, no joint swelling  Psychiatric: normal mood and affect, A&OX3  Neurologic: CN 2-12 grossly intact, Motor strength 5/5 in all 4 groups with symmetric DTR's and non-focal sensory exam  Labs on Admission:  Basic Metabolic Panel: Recent Labs  Lab 06/20/17 0916  NA 134*  K 3.5  CL 97*  CO2 24  GLUCOSE 137*  BUN 18  CREATININE 1.65*  CALCIUM 9.8   Liver Function Tests: Recent Labs  Lab 06/20/17 0916  AST 41  ALT 56  ALKPHOS 59  BILITOT 1.5*  PROT 9.2*  ALBUMIN 4.7   Recent Labs  Lab  06/20/17 0916  LIPASE 18   No results for input(s): AMMONIA in the last 168 hours. CBC: Recent Labs  Lab 06/20/17 0916  WBC 14.4*  HGB 17.9  HCT 53.8*  MCV 89.3  PLT 298   Cardiac Enzymes: No results for input(s): CKTOTAL, CKMB, CKMBINDEX, TROPONINI in the last 168 hours.  BNP (last 3 results) No results for input(s): BNP in the last 8760 hours.  ProBNP (last 3 results) No results for input(s): PROBNP in the last 8760 hours.  CBG: No results for input(s): GLUCAP in the  last 168 hours.  Radiological Exams on Admission: Ct Abdomen Pelvis W Contrast  Result Date: Weeks CLINICAL DATA:  36 year old male with history of abdominal cramping, nausea, vomiting and diarrhea since 06/19/2017. EXAM: CT ABDOMEN AND PELVIS WITH CONTRAST TECHNIQUE: Multidetector CT imaging of the abdomen and pelvis was performed using the standard protocol following bolus administration of intravenous contrast. CONTRAST:  100mL ISOVUE-300 IOPAMIDOL (ISOVUE-300) INJECTION 61% COMPARISON:  No priors. FINDINGS: Lower chest: Unremarkable. Hepatobiliary: Diffuse low attenuation throughout the hepatic parenchyma, indicative of severe hepatic steatosis. No cystic or solid hepatic lesions. No intra or extrahepatic biliary ductal dilatation. Gallbladder is nearly completely decompressed, but otherwise unremarkable in appearance. Pancreas: No pancreatic mass. No pancreatic ductal dilatation. No pancreatic or peripancreatic fluid or inflammatory changes. Spleen: Unremarkable. Adrenals/Urinary Tract: Bilateral adrenal glands and bilateral kidneys are normal in appearance. No hydroureteronephrosis. Urinary bladder is normal in appearance. Stomach/Bowel: The appearance of the stomach is normal. There is no pathologic dilatation of small bowel or colon. Normal appendix. Vascular/Lymphatic: Atherosclerosis in the pelvic vasculature. No aneurysm or dissection noted in the abdominal or pelvic vasculature. No lymphadenopathy noted in the abdomen or pelvis. Reproductive: Prostate gland and seminal vesicles are unremarkable in appearance. Other: No significant volume of ascites.  No pneumoperitoneum. Musculoskeletal: There are no aggressive appearing lytic or blastic lesions noted in the visualized portions of the skeleton. IMPRESSION: 1. No acute findings are noted in the abdomen or pelvis to account for the patient's symptoms. 2. However, there is severe hepatic steatosis. 3. Atherosclerosis. Electronically Signed   By:  Trudie Reedaniel  Entrikin M.D.   On: Weeks 12:48    EKG: Independently reviewed.  Assessment/Plan Principal Problem:   Abdominal pain Active Problems:   Intractable nausea and vomiting   Tachycardia   Accelerated hypertension   Will observe on floor with empiric IV ABX and IV fluids. Stool studies pending. Consult GI. IV Dilaudid as needed for pain. Repeat labs in AM  Diet: clear liquids Fluids: NS@125  DVT Prophylaxis: SQ Heparin  Code Status: FULL  Family Communication: yes  Disposition Plan: home  Time spent: 50 min

## 2017-06-20 NOTE — Patient Care Conference (Signed)
[facility] Brooks,RN.

## 2017-06-20 NOTE — ED Notes (Addendum)
Called report to KeyCorpBerenice RN . Secretary stated she would call me back

## 2017-06-20 NOTE — ED Notes (Signed)
Lad called, tech Marcelino DusterMichelle reported patient is positive for Salmonella

## 2017-06-21 DIAGNOSIS — R197 Diarrhea, unspecified: Secondary | ICD-10-CM

## 2017-06-21 DIAGNOSIS — R112 Nausea with vomiting, unspecified: Secondary | ICD-10-CM

## 2017-06-21 DIAGNOSIS — R1030 Lower abdominal pain, unspecified: Secondary | ICD-10-CM

## 2017-06-21 LAB — MAGNESIUM: Magnesium: 1.1 mg/dL — ABNORMAL LOW (ref 1.7–2.4)

## 2017-06-21 LAB — RAPID HIV SCREEN (HIV 1/2 AB+AG)
HIV 1/2 Antibodies: NONREACTIVE
HIV-1 P24 Antigen - HIV24: NONREACTIVE

## 2017-06-21 LAB — CBC
HEMATOCRIT: 48.8 % (ref 40.0–52.0)
HEMOGLOBIN: 16.4 g/dL (ref 13.0–18.0)
MCH: 29.7 pg (ref 26.0–34.0)
MCHC: 33.6 g/dL (ref 32.0–36.0)
MCV: 88.3 fL (ref 80.0–100.0)
Platelets: 207 10*3/uL (ref 150–440)
RBC: 5.53 MIL/uL (ref 4.40–5.90)
RDW: 14.1 % (ref 11.5–14.5)
WBC: 5.1 10*3/uL (ref 3.8–10.6)

## 2017-06-21 LAB — COMPREHENSIVE METABOLIC PANEL
ALBUMIN: 3.8 g/dL (ref 3.5–5.0)
ALT: 42 U/L (ref 17–63)
ANION GAP: 10 (ref 5–15)
AST: 35 U/L (ref 15–41)
Alkaline Phosphatase: 39 U/L (ref 38–126)
BILIRUBIN TOTAL: 1.4 mg/dL — AB (ref 0.3–1.2)
BUN: 16 mg/dL (ref 6–20)
CO2: 23 mmol/L (ref 22–32)
Calcium: 8.1 mg/dL — ABNORMAL LOW (ref 8.9–10.3)
Chloride: 99 mmol/L — ABNORMAL LOW (ref 101–111)
Creatinine, Ser: 1.59 mg/dL — ABNORMAL HIGH (ref 0.61–1.24)
GFR calc non Af Amer: 54 mL/min — ABNORMAL LOW (ref >60)
GLUCOSE: 118 mg/dL — AB (ref 65–99)
POTASSIUM: 3.1 mmol/L — AB (ref 3.5–5.1)
SODIUM: 132 mmol/L — AB (ref 135–145)
TOTAL PROTEIN: 7.5 g/dL (ref 6.5–8.1)

## 2017-06-21 MED ORDER — IBUPROFEN 400 MG PO TABS
400.0000 mg | ORAL_TABLET | Freq: Three times a day (TID) | ORAL | Status: DC | PRN
  Administered 2017-06-21 – 2017-06-22 (×2): 400 mg via ORAL
  Filled 2017-06-21 (×2): qty 1

## 2017-06-21 MED ORDER — SUCRALFATE 1 G PO TABS
1.0000 g | ORAL_TABLET | Freq: Three times a day (TID) | ORAL | Status: DC
  Administered 2017-06-21 – 2017-06-23 (×9): 1 g via ORAL
  Filled 2017-06-21 (×9): qty 1

## 2017-06-21 MED ORDER — POTASSIUM CHLORIDE CRYS ER 20 MEQ PO TBCR
20.0000 meq | EXTENDED_RELEASE_TABLET | Freq: Two times a day (BID) | ORAL | Status: DC
  Administered 2017-06-21 – 2017-06-23 (×5): 20 meq via ORAL
  Filled 2017-06-21 (×5): qty 1

## 2017-06-21 MED ORDER — MAGNESIUM SULFATE 2 GM/50ML IV SOLN
2.0000 g | Freq: Once | INTRAVENOUS | Status: AC
  Administered 2017-06-21: 2 g via INTRAVENOUS
  Filled 2017-06-21: qty 50

## 2017-06-21 MED ORDER — IBUPROFEN 400 MG PO TABS
400.0000 mg | ORAL_TABLET | Freq: Once | ORAL | Status: AC
  Administered 2017-06-21: 400 mg via ORAL
  Filled 2017-06-21: qty 1

## 2017-06-21 NOTE — Progress Notes (Signed)
Pt. Has had multiple loose stools hourly.

## 2017-06-21 NOTE — Progress Notes (Signed)
[employer] - [place] [facility]   PATIENT NAME: Danny PalingJeffery Hockman    MR#:  [id number]  DATE OF BIRTH:  12-22-1980  SUBJECTIVE:  CHIEF COMPLAINT:   Chief Complaint  Patient presents with  . Abdominal Pain   Came with abd pain and vomiting after eating " bad food" Was dehydrated, some better now, still afraid to try full liquid, want to continue clear liquid.  REVIEW OF SYSTEMS:  CONSTITUTIONAL: No fever, fatigue or weakness.  EYES: No blurred or double vision.  EARS, NOSE, AND THROAT: No tinnitus or ear pain.  RESPIRATORY: No cough, shortness of breath, wheezing or hemoptysis.  CARDIOVASCULAR: No chest pain, orthopnea, edema.  GASTROINTESTINAL: No nausea, vomiting, diarrhea or abdominal pain.  GENITOURINARY: No dysuria, hematuria.  ENDOCRINE: No polyuria, nocturia,  HEMATOLOGY: No anemia, easy bruising or bleeding SKIN: No rash or lesion. MUSCULOSKELETAL: No joint pain or arthritis.   NEUROLOGIC: No tingling, numbness, weakness.  PSYCHIATRY: No anxiety or depression.   ROS  DRUG ALLERGIES:  No Known Allergies  VITALS:  Blood pressure 125/87, pulse (!) 124, temperature 98.7 F (37.1 C), temperature source Oral, resp. rate 18, height 6\' 2"  (1.88 m), weight 122 kg (269 lb), SpO2 94 %.  PHYSICAL EXAMINATION:  GENERAL:  36 y.o.-year-old patient lying in the bed with no acute distress.  EYES: Pupils equal, round, reactive to light and accommodation. No scleral icterus. Extraocular muscles intact.  HEENT: Head atraumatic, normocephalic. Oropharynx and nasopharynx clear.  NECK:  Supple, no jugular venous distention. No thyroid enlargement, no tenderness.  LUNGS: Normal breath sounds bilaterally, no wheezing, rales,rhonchi or crepitation. No use of accessory muscles of respiration.  CARDIOVASCULAR: S1, S2 normal. No murmurs, rubs, or gallops.  ABDOMEN: Soft, nontender, nondistended. Bowel sounds present. No organomegaly or mass.  EXTREMITIES: No pedal edema,  cyanosis, or clubbing.  NEUROLOGIC: Cranial nerves II through XII are intact. Muscle strength 5/5 in all extremities. Sensation intact. Gait not checked.  PSYCHIATRIC: The patient is alert and oriented x 3.  SKIN: No obvious rash, lesion, or ulcer.   Physical Exam LABORATORY PANEL:   CBC Recent Labs  Lab 06/21/17 0425  WBC 5.1  HGB 16.4  HCT 48.8  PLT 207   ------------------------------------------------------------------------------------------------------------------  Chemistries  Recent Labs  Lab 06/21/17 0425  NA 132*  K 3.1*  CL 99*  CO2 23  GLUCOSE 118*  BUN 16  CREATININE 1.59*  CALCIUM 8.1*  MG 1.1*  AST 35  ALT 42  ALKPHOS 39  BILITOT 1.4*   ------------------------------------------------------------------------------------------------------------------  Cardiac Enzymes No results for input(s): TROPONINI in the last 168 hours. ------------------------------------------------------------------------------------------------------------------  RADIOLOGY:  Ct Abdomen Pelvis W Contrast  Result Date: 06/20/2017 CLINICAL DATA:  36 year old male with history of abdominal cramping, nausea, vomiting and diarrhea since 06/19/2017. EXAM: CT ABDOMEN AND PELVIS WITH CONTRAST TECHNIQUE: Multidetector CT imaging of the abdomen and pelvis was performed using the standard protocol following bolus administration of intravenous contrast. CONTRAST:  100mL ISOVUE-300 IOPAMIDOL (ISOVUE-300) INJECTION 61% COMPARISON:  No priors. FINDINGS: Lower chest: Unremarkable. Hepatobiliary: Diffuse low attenuation throughout the hepatic parenchyma, indicative of severe hepatic steatosis. No cystic or solid hepatic lesions. No intra or extrahepatic biliary ductal dilatation. Gallbladder is nearly completely decompressed, but otherwise unremarkable in appearance. Pancreas: No pancreatic mass. No pancreatic ductal dilatation. No pancreatic or peripancreatic fluid or inflammatory changes. Spleen:  Unremarkable. Adrenals/Urinary Tract: Bilateral adrenal glands and bilateral kidneys are normal in appearance. No hydroureteronephrosis. Urinary bladder is normal in appearance. Stomach/Bowel: The appearance of the  stomach is normal. There is no pathologic dilatation of small bowel or colon. Normal appendix. Vascular/Lymphatic: Atherosclerosis in the pelvic vasculature. No aneurysm or dissection noted in the abdominal or pelvic vasculature. No lymphadenopathy noted in the abdomen or pelvis. Reproductive: Prostate gland and seminal vesicles are unremarkable in appearance. Other: No significant volume of ascites.  No pneumoperitoneum. Musculoskeletal: There are no aggressive appearing lytic or blastic lesions noted in the visualized portions of the skeleton. IMPRESSION: 1. No acute findings are noted in the abdomen or pelvis to account for the patient's symptoms. 2. However, there is severe hepatic steatosis. 3. Atherosclerosis. Electronically Signed   By: Trudie Reedaniel  Entrikin M.D.   On: 06/20/2017 12:48    ASSESSMENT AND PLAN:   Principal Problem:   Abdominal pain Active Problems:   Intractable nausea and vomiting   Tachycardia   Accelerated hypertension  * Sepsis- salmonella infection   Had tachycardia, fever, elevated WBcs   Found salmonella in stool.   On Cipro, stop flagyl.    Check for HIV>  * Diarrhea, abd pain , vomiting   COnt supportive care   IV fluids, pain meds  * hypokalemia   Replace k, IV and oral.  * Hypomagnesemia   Replace IV.  * GERD   PPI and Sucralfate.    All the records are reviewed and case discussed with Care Management/Social Workerr. Management plans discussed with the patient, family and they are in agreement.  CODE STATUS: full.  TOTAL TIME TAKING CARE OF THIS PATIENT: 35 minutes.     POSSIBLE D/C IN 1-2 DAYS, DEPENDING ON CLINICAL CONDITION.   Altamese DillingVaibhavkumar Ayonna Speranza M.D on 06/21/2017   Between 7am to 6pm - Pager - [phone]  After 6pm go to  [email or web address] - password [email or web address]  [facility]  Office  [phone]  CC: Primary care physician; Patient, No Pcp Per  Note: This dictation was prepared with Dragon dictation along with smaller phrase technology. Any transcriptional errors that result from this process are unintentional.

## 2017-06-21 NOTE — Plan of Care (Signed)
  Progressing Clinical Measurements: Will remain free from infection 06/21/2017 2057 - Progressing by Stefan Churchogers, Nazier Neyhart M, RN Diagnostic test results will improve 06/21/2017 2057 - Progressing by Stefan Churchogers, Bitania Shankland M, RN Respiratory complications will improve 06/21/2017 2057 - Progressing by Stefan Churchogers, Margareth Kanner M, RN

## 2017-06-21 NOTE — Progress Notes (Signed)
Dr. Tobi BastosPyreddy returned page, order for ibuprofen 400mg  x1 dose.

## 2017-06-21 NOTE — Progress Notes (Signed)
BP continues to improve, HR still running in 140's.

## 2017-06-21 NOTE — Progress Notes (Signed)
Patient's family member approached this RN at the nurse's station wanting to know what the patient's most recent B.P. and temperature was. Informed family member the most recent B.P. = 154 systolic. No specific parameters given in regard to when to administer. Explained that in the absence of a specific parameter I would administer if the patient's B.P. exceeds 160 systolic. Family member states "OK". Then the family member states she feels "something should be given" in regard to patient's temperature. Originally she requested Ibuprofen, but Ibuprofen isn't ordered for this patient. Stated that I could give Tylenol, which has an antipyretic property. Informed the family member that I was looking back through the chart, it appeared patient had a temperature of 102.4 last night around 2 AM, and that Tylenol was never administered after that time. Family member states that it was given sometime earlier in the evening, and that that dose had to be the reason that the patient's temperature had eventually came back down. I stated that most likely she was correct, but that it was unclear what actually brought the patient's temperature back down. It seemed to have come down with no pharmacological intervention at all. Nonetheless I told the family member I would in fact administer the PRN Tylenol for the patient's fever and would pass along to recheck the temperature in about an hour's time. Family member very belligerent and upset that it seemed the Tylenol worked prior to this incident, as I repeatedly told the family member the Tylenol was never administered after the last fever spike. Thus there is absolutely no proof the Tylenol brought the patient's fever down. To the contrary it may have been completely ineffective. Also, vital signs were never taken for this patient around the 4P hour. Contacted the NT who didn't state a reason as to why this wasn't performed. Family member requested to speak w/ charge nurse Morrie SheldonAshley  who spoke with the family and rechecked patient's B.P. Diastolic BP went down, whereas systolic BP remained about the same. Will do bedside reporting with change of shift and answer any additional questions / concerns the patient or family member(s) may have. Jari FavreSteven M [facility]

## 2017-06-21 NOTE — Progress Notes (Signed)
Pt. Has had multiple stools hourly throughout the night. Unable to obtain urine sample related to contamination with stool. Heart rate has improved throughout the night, at the beginning of the shift HR was running in 140's now HR is in mid to low 120's. Pt. Did run a fever throughout the night. Pt. Was given tylenol and ibuprofen P.O. And fever decreased. Blood pressure also decreased through out the night. Pt. Continued having ABD pain through the night and was medicated eat time.

## 2017-06-21 NOTE — Progress Notes (Signed)
Pt. Temp has increased to 102.4, oral. Prime paged, awaiting call back. Will continue to monitor pt.

## 2017-06-21 NOTE — Plan of Care (Signed)
  Clinical Measurements: Ability to maintain clinical measurements within normal limits will improve 06/21/2017 1811 - Not Progressing by Raynald BlendImhoff, Loyce Klasen M, RN Note Patient's sodium level today = only 132. Will continue to monitor. Patient currently on IV fluid. Jari FavreSteven M [facility]

## 2017-06-22 DIAGNOSIS — R197 Diarrhea, unspecified: Secondary | ICD-10-CM

## 2017-06-22 DIAGNOSIS — R112 Nausea with vomiting, unspecified: Secondary | ICD-10-CM

## 2017-06-22 LAB — BASIC METABOLIC PANEL
Anion gap: 9 (ref 5–15)
BUN: 13 mg/dL (ref 6–20)
CALCIUM: 8.1 mg/dL — AB (ref 8.9–10.3)
CO2: 22 mmol/L (ref 22–32)
Chloride: 99 mmol/L — ABNORMAL LOW (ref 101–111)
Creatinine, Ser: 1.5 mg/dL — ABNORMAL HIGH (ref 0.61–1.24)
GFR calc Af Amer: >60 mL/min (ref >60)
GFR, EST NON AFRICAN AMERICAN: 58 mL/min — AB (ref >60)
GLUCOSE: 129 mg/dL — AB (ref 65–99)
Potassium: 3.3 mmol/L — ABNORMAL LOW (ref 3.5–5.1)
Sodium: 130 mmol/L — ABNORMAL LOW (ref 135–145)

## 2017-06-22 LAB — HIV ANTIBODY (ROUTINE TESTING W REFLEX): HIV SCREEN 4TH GENERATION: NONREACTIVE

## 2017-06-22 LAB — MAGNESIUM: MAGNESIUM: 1.6 mg/dL — AB (ref 1.7–2.4)

## 2017-06-22 MED ORDER — OXYCODONE-ACETAMINOPHEN 5-325 MG PO TABS
1.0000 | ORAL_TABLET | Freq: Four times a day (QID) | ORAL | Status: DC | PRN
  Administered 2017-06-22: 1 via ORAL
  Filled 2017-06-22: qty 1

## 2017-06-22 MED ORDER — OXYCODONE-ACETAMINOPHEN 5-325 MG PO TABS: 1.0000 | ORAL_TABLET | ORAL | Status: DC | PRN

## 2017-06-22 NOTE — Consult Note (Signed)
Midge Miniumarren Herma Uballe, MD [facility]  [address] [place], [place] [address] Phone: [phone] Fax : [phone]  Consultation  Referring Provider:     Dr. Elisabeth PigeonVachhani Primary Care Physician:  Patient, No Pcp Per Primary Gastroenterologist: Gentry FitzUnassigned         Reason for Consultation:     Nausea vomiting diarrhea  Date of Admission:  06/20/2017 Date of Consultation:  06/22/2017         HPI:   Danny Weeks is a 36 y.o. male who reports that he became sick after eating Mal[place]key on Thanksgiving.  The patient states that there were no other people who got sick but he states there were 2 turkeys at his dinner table.  The patient ate from 1 Mal[place]key that was supposed to be made into Malawiturkey salad while the rest of the family ate the other Malawiturkey.  When the Malawiturkey that he ate was opened up to make salad he states that the family found it to be raw and looked bad.  The patient states that he started to have abdominal pain with diarrhea nausea and vomiting.  The patient reports that the vomiting had stopped since he has been in the hospital and his diarrhea has continued with lower abdominal pain.  He denies any blood in the stools.  The patient was found to have an elevated white cell count.  His white cell count was 14.4 on admission.  History reviewed. No pertinent past medical history.  History reviewed. No pertinent surgical history.  Prior to Admission medications   Medication Sig Start Date End Date Taking? Authorizing Provider  ketorolac (TORADOL) 10 MG tablet Take 1 tablet (10 mg total) by mouth every 6 (six) hours as needed. Patient not taking: Reported on 06/20/2017 07/03/15   Kem Boroughsriplett, Cari B, FNP  predniSONE (STERAPRED UNI-PAK 21 TAB) 10 MG (21) TBPK tablet Take 6 tablets on day 1 Take 5 tablets on day 2 Take 4 tablets on day 3 Take 3 tablets on day 4 Take 2 tablets on day 5 Take 1 tablet on day 6 Patient not taking: Reported on 06/20/2017 07/03/15   Chinita Pesterriplett, Cari B, FNP    History  reviewed. No pertinent family history.   Social History   Tobacco Use  . Smoking status: Current Every Day Smoker    Packs/day: 1.00    Types: Cigarettes  . Smokeless tobacco: Never Used  Substance Use Topics  . Alcohol use: Yes  . Drug use: No    Allergies as of 06/20/2017  . (No Known Allergies)    Review of Systems:    All systems reviewed and negative except where noted in HPI.   Physical Exam:  Vital signs in last 24 hours: Temp:  [98.5 F (36.9 C)-103.3 F (39.6 C)] 98.5 F (36.9 C) (11/27 0734) Pulse Rate:  [99-140] 108 (11/27 0734) Resp:  [16-19] 16 (11/27 0734) BP: (109-189)/(66-116) 119/79 (11/27 0734) SpO2:  [92 %-99 %] 92 % (11/27 0734) Weight:  [268 lb 9.6 oz (121.8 kg)] 268 lb 9.6 oz (121.8 kg) (11/27 0522) Last BM Date: 06/20/17 General:   Pleasant, cooperative in NAD Head:  Normocephalic and atraumatic. Eyes:   No icterus.   Conjunctiva pink. PERRLA. Ears:  Normal auditory acuity. Neck:  Supple; no masses or thyroidomegaly Lungs: Respirations even and unlabored. Lungs clear to auscultation bilaterally.   No wheezes, crackles, or rhonchi.  Heart:  Regular rate and rhythm;  Without murmur, clicks, rubs or gallops Abdomen:  Soft, nondistended,  nontender. Normal bowel sounds. No appreciable masses or hepatomegaly.  No rebound or guarding.  Rectal:  Not performed. Msk:  Symmetrical without gross deformities.    Extremities:  Without edema, cyanosis or clubbing. Neurologic:  Alert and oriented x3;  grossly normal neurologically. Skin:  Intact without significant lesions or rashes. Cervical Nodes:  No significant cervical adenopathy. Psych:  Alert and cooperative. Normal affect.  LAB RESULTS: Recent Labs    06/20/17 0916 06/21/17 0425  WBC 14.4* 5.1  HGB 17.9 16.4  HCT 53.8* 48.8  PLT 298 207   BMET Recent Labs    06/20/17 0916 06/21/17 0425 06/22/17 0621  NA 134* 132* 130*  K 3.5 3.1* 3.3*  CL 97* 99* 99*  CO2 24 23 22   GLUCOSE 137* 118*  129*  BUN 18 16 13   CREATININE 1.65* 1.59* 1.50*  CALCIUM 9.8 8.1* 8.1*   LFT Recent Labs    06/21/17 0425  PROT 7.5  ALBUMIN 3.8  AST 35  ALT 42  ALKPHOS 39  BILITOT 1.4*   PT/INR No results for input(s): LABPROT, INR in the last 72 hours.  STUDIES: No results found.    Impression / Plan:   Danny Weeks is a 36 y.o. y/o male with with ingestion of what appears to be an uncooked/rotting Mal[place]urkey on Thanksgiving.  The patient's abdominal pain has been persistent and his diarrhea has continued.  His nausea and vomiting has resolved.  The patient did have a CT scan of the abdomen that showed him to have severe hepatic steatosis but no acute findings.  The patient should continue conservative and supportive management.  The patient is presently on antibiotics.  I will continue following the patient with you.  Thank you for involving me in the care of this patient.      LOS: 0 days   Midge Miniumarren Hoda Hon, MD  06/22/2017, 2:44 PM   Note: This dictation was prepared with Dragon dictation along with smaller phrase technology. Any transcriptional errors that result from this process are unintentional.

## 2017-06-22 NOTE — Progress Notes (Signed)
Notified MD of Temp increase to 103. Orders placed. Will continue to monitor and assess.

## 2017-06-23 LAB — CBC
HEMATOCRIT: 42.6 % (ref 40.0–52.0)
Hemoglobin: 14.1 g/dL (ref 13.0–18.0)
MCH: 29.4 pg (ref 26.0–34.0)
MCHC: 33.1 g/dL (ref 32.0–36.0)
MCV: 88.9 fL (ref 80.0–100.0)
PLATELETS: 170 10*3/uL (ref 150–440)
RBC: 4.79 MIL/uL (ref 4.40–5.90)
RDW: 13.7 % (ref 11.5–14.5)
WBC: 5 10*3/uL (ref 3.8–10.6)

## 2017-06-23 LAB — BASIC METABOLIC PANEL
Anion gap: 7 (ref 5–15)
BUN: 11 mg/dL (ref 6–20)
CO2: 22 mmol/L (ref 22–32)
CREATININE: 1.16 mg/dL (ref 0.61–1.24)
Calcium: 8.3 mg/dL — ABNORMAL LOW (ref 8.9–10.3)
Chloride: 102 mmol/L (ref 101–111)
GFR calc Af Amer: >60 mL/min (ref >60)
GLUCOSE: 95 mg/dL (ref 65–99)
Potassium: 3.2 mmol/L — ABNORMAL LOW (ref 3.5–5.1)
SODIUM: 131 mmol/L — AB (ref 135–145)

## 2017-06-23 LAB — MAGNESIUM: Magnesium: 1.9 mg/dL (ref 1.7–2.4)

## 2017-06-23 MED ORDER — POTASSIUM CHLORIDE CRYS ER 20 MEQ PO TBCR: 20.0000 meq | EXTENDED_RELEASE_TABLET | Freq: Two times a day (BID) | ORAL | 0 refills | Status: DC

## 2017-06-23 MED ORDER — IBUPROFEN 400 MG PO TABS: 400.0000 mg | ORAL_TABLET | Freq: Three times a day (TID) | ORAL | 0 refills | Status: DC | PRN

## 2017-06-23 MED ORDER — POTASSIUM CHLORIDE CRYS ER 20 MEQ PO TBCR: 40.0000 meq | EXTENDED_RELEASE_TABLET | Freq: Once | ORAL | Status: DC

## 2017-06-23 MED ORDER — CIPROFLOXACIN HCL 500 MG PO TABS: 500.0000 mg | ORAL_TABLET | Freq: Two times a day (BID) | ORAL | 0 refills | Status: AC

## 2017-06-23 MED ORDER — PANTOPRAZOLE SODIUM 40 MG PO TBEC
40.0000 mg | DELAYED_RELEASE_TABLET | Freq: Once | ORAL | Status: AC
Start: 2017-06-23 — End: 2017-06-23
  Administered 2017-06-23: 40 mg via ORAL
  Filled 2017-06-23: qty 1

## 2017-06-23 MED ORDER — PANTOPRAZOLE SODIUM 40 MG PO TBEC: 40.0000 mg | DELAYED_RELEASE_TABLET | Freq: Every day | ORAL | 0 refills | Status: DC

## 2017-06-23 MED ORDER — SUCRALFATE 1 G PO TABS: 1.0000 g | ORAL_TABLET | Freq: Three times a day (TID) | ORAL | 0 refills | Status: DC

## 2017-06-23 NOTE — Plan of Care (Signed)
  Progressing Clinical Measurements: Ability to maintain clinical measurements within normal limits will improve 06/23/2017 0011 - Progressing by Stefan Churchogers, Mance Vallejo M, RN

## 2017-06-23 NOTE — Progress Notes (Signed)
IV pulled out accidentally.  Patient tells me he's due to go home today.  Contacted Dr. Elisabeth PigeonVachhani about whether to restart the IV or not.  He said to wait at this time.

## 2017-06-23 NOTE — Progress Notes (Signed)
[facility] [place] [facility]   PATIENT NAME: Danny Weeks    MR#:  [id number]  DATE OF BIRTH:  [birth year]  SUBJECTIVE:  CHIEF COMPLAINT:   Chief Complaint  Patient presents with  . Abdominal Pain   Came with abd pain and vomiting after eating " bad food" Was dehydrated, some better now, Asking to upgrade diet today. Less pain now. Still had fever.  REVIEW OF SYSTEMS:  CONSTITUTIONAL: No fever, fatigue or weakness.  EYES: No blurred or double vision.  EARS, NOSE, AND THROAT: No tinnitus or ear pain.  RESPIRATORY: No cough, shortness of breath, wheezing or hemoptysis.  CARDIOVASCULAR: No chest pain, orthopnea, edema.  GASTROINTESTINAL: No nausea, vomiting, diarrhea or abdominal pain.  GENITOURINARY: No dysuria, hematuria.  ENDOCRINE: No polyuria, nocturia,  HEMATOLOGY: No anemia, easy bruising or bleeding SKIN: No rash or lesion. MUSCULOSKELETAL: No joint pain or arthritis.   NEUROLOGIC: No tingling, numbness, weakness.  PSYCHIATRY: No anxiety or depression.   ROS  DRUG ALLERGIES:  No Known Allergies  VITALS:  Blood pressure 122/87, pulse 89, temperature 98.4 F (36.9 C), temperature source Oral, resp. rate 12, height 6\' 2"  (1.88 m), weight 121.8 kg (268 lb 9.6 oz), SpO2 98 %.  PHYSICAL EXAMINATION:  GENERAL:  36 y.o.-year-old patient lying in the bed with no acute distress.  EYES: Pupils equal, round, reactive to light and accommodation. No scleral icterus. Extraocular muscles intact.  HEENT: Head atraumatic, normocephalic. Oropharynx and nasopharynx clear.  NECK:  Supple, no jugular venous distention. No thyroid enlargement, no tenderness.  LUNGS: Normal breath sounds bilaterally, no wheezing, rales,rhonchi or crepitation. No use of accessory muscles of respiration.  CARDIOVASCULAR: S1, S2 normal. No murmurs, rubs, or gallops.  ABDOMEN: Soft, nontender, nondistended. Bowel sounds present. No organomegaly or mass.  EXTREMITIES: No pedal edema,  cyanosis, or clubbing.  NEUROLOGIC: Cranial nerves II through XII are intact. Muscle strength 5/5 in all extremities. Sensation intact. Gait not checked.  PSYCHIATRIC: The patient is alert and oriented x 3.  SKIN: No obvious rash, lesion, or ulcer.   Physical Exam LABORATORY PANEL:   CBC Recent Labs  Lab 06/23/17 0806  WBC 5.0  HGB 14.1  HCT 42.6  PLT 170   ------------------------------------------------------------------------------------------------------------------  Chemistries  Recent Labs  Lab 06/21/17 0425  06/23/17 0806  NA 132*   < > 131*  K 3.1*   < > 3.2*  CL 99*   < > 102  CO2 23   < > 22  GLUCOSE 118*   < > 95  BUN 16   < > 11  CREATININE 1.59*   < > 1.16  CALCIUM 8.1*   < > 8.3*  MG 1.1*   < > 1.9  AST 35  --   --   ALT 42  --   --   ALKPHOS 39  --   --   BILITOT 1.4*  --   --    < > = values in this interval not displayed.   ------------------------------------------------------------------------------------------------------------------  Cardiac Enzymes No results for input(s): TROPONINI in the last 168 hours. ------------------------------------------------------------------------------------------------------------------  RADIOLOGY:  No results found.  ASSESSMENT AND PLAN:   Principal Problem:   Abdominal pain Active Problems:   Intractable nausea and vomiting   Tachycardia   Accelerated hypertension   Nausea vomiting and diarrhea  * Sepsis- salmonella infection   Had tachycardia, fever, elevated WBcs   Found salmonella in stool.   On Cipro, stop flagyl.    Checked for  HIV> negative.    Appreciated GI hlep.   Upgrade diet.  * Diarrhea, abd pain , vomiting   COnt supportive care   IV fluids, pain meds  * hypokalemia   Replace k, IV and oral.  * Hypomagnesemia   Replace IV.  * GERD   PPI and Sucralfate.    All the records are reviewed and case discussed with Care Management/Social Workerr. Management plans discussed  with the patient, family and they are in agreement.  CODE STATUS: full.  TOTAL TIME TAKING CARE OF THIS PATIENT: 35 minutes.     POSSIBLE D/C IN 1-2 DAYS, DEPENDING ON CLINICAL CONDITION.   Altamese DillingVaibhavkumar Tailor Westfall M.D on 06/23/2017   Between 7am to 6pm - Pager - [phone]  After 6pm go to [email or web address] - password [email or web address]  [facility]  Office  [phone]  CC: Primary care physician; Patient, No Pcp Per  Note: This dictation was prepared with Dragon dictation along with smaller phrase technology. Any transcriptional errors that result from this process are unintentional.

## 2017-06-23 NOTE — Progress Notes (Signed)
Pt d/c to home today.  IV removed intact.  D/c paperwork printed and reviewed w/pt.  All medication questions and concerns reviewed and pt states understanding.  All Rx's given to patient. Pt requested to wheelchaired out for d/c.    

## 2017-06-23 NOTE — Progress Notes (Signed)
[facility]         [place], [place]   06/23/2017  Patient: Danny PalingJeffery Goodridge   Date of Birth:  04/24/1981  Date of admission:  06/20/2017  Date of Discharge  06/23/2017    To Whom it May Concern:   Danny Weeks  may return to work on 06/27/17.  PHYSICAL ACTIVITY:  Full  If you have any questions or concerns, please don't hesitate to call.  Sincerely,   Altamese DillingVaibhavkumar Lasalle Abee M.D Pager [phone]- [phone] Office : [phone]   .

## 2017-06-25 LAB — CULTURE, BLOOD (ROUTINE X 2)
CULTURE: NO GROWTH
Culture: NO GROWTH
Special Requests: ADEQUATE
Special Requests: ADEQUATE

## 2017-06-29 NOTE — Discharge Summary (Signed)
[facility] [place] [facility]   PATIENT NAME: Danny Weeks Allum    MR#:  [id number]  DATE OF BIRTH:  Jul 05, 1981  DATE OF ADMISSION:  06/20/2017 ADMITTING PHYSICIAN: Marguarite ArbourJeffrey D Sparks, MD  DATE OF DISCHARGE: 06/23/2017  2:47 PM  PRIMARY CARE PHYSICIAN: Patient, No Pcp Per    ADMISSION DIAGNOSIS:  Dehydration [E86.0] Tachycardia [R00.0] Lower abdominal pain [R10.30] Nausea vomiting and diarrhea [R11.2, R19.7]  DISCHARGE DIAGNOSIS:  Principal Problem:   Abdominal pain Active Problems:   Intractable nausea and vomiting   Tachycardia   Accelerated hypertension   Nausea vomiting and diarrhea   SECONDARY DIAGNOSIS:  History reviewed. No pertinent past medical history.  HOSPITAL COURSE:   * Sepsis- salmonella infection   Had tachycardia, fever, elevated WBcs   Found salmonella in stool.   On Cipro, stop flagyl.    Checked for HIV> negative.    Appreciated GI hlep.   Upgrade diet. Better, tolerated.  * Diarrhea, abd pain , vomiting   COnt supportive care   IV fluids, pain meds  * hypokalemia   Replace k, IV and oral.  * Hypomagnesemia   Replace IV.  * GERD   PPI and Sucralfate.    DISCHARGE CONDITIONS:   Follow with PCP as needed.  CONSULTS OBTAINED:  Treatment Team:  Midge MiniumWohl, Darren, MD  DRUG ALLERGIES:  No Known Allergies  DISCHARGE MEDICATIONS:   Allergies as of 06/23/2017   No Known Allergies     Medication List    STOP taking these medications   ketorolac 10 MG tablet Commonly known as:  TORADOL   predniSONE 10 MG (21) Tbpk tablet Commonly known as:  STERAPRED UNI-PAK 21 TAB     TAKE these medications   ciprofloxacin 500 MG tablet Commonly known as:  CIPRO Take 1 tablet (500 mg total) by mouth 2 (two) times daily for 6 days.   ibuprofen 400 MG tablet Commonly known as:  ADVIL,MOTRIN Take 1 tablet (400 mg total) by mouth every 8 (eight) hours as needed for fever or mild pain.   pantoprazole 40 MG  tablet Commonly known as:  PROTONIX Take 1 tablet (40 mg total) by mouth daily.   potassium chloride SA 20 MEQ tablet Commonly known as:  K-DUR,KLOR-CON Take 1 tablet (20 mEq total) by mouth 2 (two) times daily for 3 days.   sucralfate 1 g tablet Commonly known as:  CARAFATE Take 1 tablet (1 g total) by mouth 4 (four) times daily -  with meals and at bedtime.        DISCHARGE INSTRUCTIONS:    Follow with PCP as needed.  If you experience worsening of your admission symptoms, develop shortness of breath, life threatening emergency, suicidal or homicidal thoughts you must seek medical attention immediately by calling 911 or calling your MD immediately  if symptoms less severe.  You Must read complete instructions/literature along with all the possible adverse reactions/side effects for all the Medicines you take and that have been prescribed to you. Take any new Medicines after you have completely understood and accept all the possible adverse reactions/side effects.   Please note  You were cared for by a hospitalist during your hospital stay. If you have any questions about your discharge medications or the care you received while you were in the hospital after you are discharged, you can call the unit and asked to speak with the hospitalist on call if the hospitalist that took care of you is not available. Once you  are discharged, your primary care physician will handle any further medical issues. Please note that NO REFILLS for any discharge medications will be authorized once you are discharged, as it is imperative that you return to your primary care physician (or establish a relationship with a primary care physician if you do not have one) for your aftercare needs so that they can reassess your need for medications and monitor your lab values.    Today   CHIEF COMPLAINT:   Chief Complaint  Patient presents with  . Abdominal Pain    HISTORY OF PRESENT ILLNESS:  Danny Weeks  [place]  is a 36 y.o. male with no significant past medical history  Who presents to ER with 2 day hx of worsening lower abdominal pin associated with N/V/D. Sx's began after eating some "bad [place]". In Er, pt is febrile with leukocytosis. Also noted to be tachycardic and hypertensive. No improvement in ER with IV morphine and IV fluids. CT non-diagnostic. He is now admitted.   VITAL SIGNS:  Blood pressure 122/87, pulse 89, temperature 98.4 F (36.9 C), temperature source Oral, resp. rate 12, height 6\' 2"  (1.88 m), weight 121.8 kg (268 lb 9.6 oz), SpO2 98 %.  I/O:  No intake or output data in the 24 hours ending 06/29/17 2226  PHYSICAL EXAMINATION:  GENERAL:  36 y.o.-year-old patient lying in the bed with no acute distress.  EYES: Pupils equal, round, reactive to light and accommodation. No scleral icterus. Extraocular muscles intact.  HEENT: Head atraumatic, normocephalic. Oropharynx and nasopharynx clear.  NECK:  Supple, no jugular venous distention. No thyroid enlargement, no tenderness.  LUNGS: Normal breath sounds bilaterally, no wheezing, rales,rhonchi or crepitation. No use of accessory muscles of respiration.  CARDIOVASCULAR: S1, S2 normal. No murmurs, rubs, or gallops.  ABDOMEN: Soft, non-tender, non-distended. Bowel sounds present. No organomegaly or mass.  EXTREMITIES: No pedal edema, cyanosis, or clubbing.  NEUROLOGIC: Cranial nerves II through XII are intact. Muscle strength 5/5 in all extremities. Sensation intact. Gait not checked.  PSYCHIATRIC: The patient is alert and oriented x 3.  SKIN: No obvious rash, lesion, or ulcer.   DATA REVIEW:   CBC Recent Labs  Lab 06/23/17 0806  WBC 5.0  HGB 14.1  HCT 42.6  PLT 170    Chemistries  Recent Labs  Lab 06/23/17 0806  NA 131*  K 3.2*  CL 102  CO2 22  GLUCOSE 95  BUN 11  CREATININE 1.16  CALCIUM 8.3*  MG 1.9    Cardiac Enzymes No results for input(s): TROPONINI in the last 168 hours.  Microbiology Results   Results for orders placed or performed during the hospital encounter of 06/20/17  Gastrointestinal Panel by PCR , Stool     Status: Abnormal   Collection Time: 06/20/17  2:14 PM  Result Value Ref Range Status   Campylobacter species NOT DETECTED NOT DETECTED Final   Plesimonas shigelloides NOT DETECTED NOT DETECTED Final   Salmonella species DETECTED (A) NOT DETECTED Final    Comment: RESULT CALLED TO, READ BACK BY AND VERIFIED WITH: JAY BROOKS 06/20/17 @ 1618  MLK    Yersinia enterocolitica NOT DETECTED NOT DETECTED Final   Vibrio species NOT DETECTED NOT DETECTED Final   Vibrio cholerae NOT DETECTED NOT DETECTED Final   Enteroaggregative E coli (EAEC) NOT DETECTED NOT DETECTED Final   Enteropathogenic E coli (EPEC) NOT DETECTED NOT DETECTED Final   Enterotoxigenic E coli (ETEC) NOT DETECTED NOT DETECTED Final   Shiga like toxin producing E coli (  STEC) NOT DETECTED NOT DETECTED Final   Shigella/Enteroinvasive E coli (EIEC) NOT DETECTED NOT DETECTED Final   Cryptosporidium NOT DETECTED NOT DETECTED Final   Cyclospora cayetanensis NOT DETECTED NOT DETECTED Final   Entamoeba histolytica NOT DETECTED NOT DETECTED Final   Giardia lamblia NOT DETECTED NOT DETECTED Final   Adenovirus F40/41 NOT DETECTED NOT DETECTED Final   Astrovirus NOT DETECTED NOT DETECTED Final   Norovirus GI/GII NOT DETECTED NOT DETECTED Final   Rotavirus A NOT DETECTED NOT DETECTED Final   Sapovirus (I, II, IV, and V) NOT DETECTED NOT DETECTED Final  Culture, blood (routine x 2)     Status: None   Collection Time: 06/20/17  2:39 PM  Result Value Ref Range Status   Specimen Description BLOOD LEFT ANTECUBITAL  Final   Special Requests   Final    BOTTLES DRAWN AEROBIC AND ANAEROBIC Blood Culture adequate volume   Culture NO GROWTH 5 DAYS  Final   Report Status 06/25/2017 FINAL  Final  Culture, blood (routine x 2)     Status: None   Collection Time: 06/20/17  2:40 PM  Result Value Ref Range Status   Specimen  Description BLOOD BLOOD RIGHT FOREARM  Final   Special Requests Blood Culture adequate volume  Final   Culture NO GROWTH 5 DAYS  Final   Report Status 06/25/2017 FINAL  Final    RADIOLOGY:  No results found.  EKG:   Orders placed or performed during the hospital encounter of 06/20/17  . ED EKG  . ED EKG      Management plans discussed with the patient, family and they are in agreement.  CODE STATUS:  Code Status History    Date Active Date Inactive Code Status Order ID Comments User Context   06/20/2017 18:09 06/23/2017 17:52 Full Code [id number]  Marguarite Arbour, MD ED      TOTAL TIME TAKING CARE OF THIS PATIENT: 35 minutes.    Altamese Dilling M.D on 06/29/2017 at 10:26 PM  Between 7am to 6pm - Pager - [phone]  After 6pm go to [email or web address] - password [email or web address]  [facility]  Office  [phone]  CC: Primary care physician; Patient, No Pcp Per   Note: This dictation was prepared with Dragon dictation along with smaller phrase technology. Any transcriptional errors that result from this process are unintentional.

## 2018-11-09 ENCOUNTER — Observation Stay
Admission: EM | Admit: 2018-11-09 | Discharge: 2018-11-10 | Disposition: A | Discharge status: Home / Self Care | Payer: Self-pay | Attending: Family Medicine | Admitting: Family Medicine

## 2018-11-09 ENCOUNTER — Other Ambulatory Visit: Payer: Self-pay

## 2018-11-09 DIAGNOSIS — K759 Inflammatory liver disease, unspecified: Secondary | ICD-10-CM

## 2018-11-09 DIAGNOSIS — F17210 Nicotine dependence, cigarettes, uncomplicated: Secondary | ICD-10-CM | POA: Insufficient documentation

## 2018-11-09 DIAGNOSIS — I10 Essential (primary) hypertension: Secondary | ICD-10-CM | POA: Diagnosis present

## 2018-11-09 DIAGNOSIS — R040 Epistaxis: Principal | ICD-10-CM | POA: Insufficient documentation

## 2018-11-09 DIAGNOSIS — Z79899 Other long term (current) drug therapy: Secondary | ICD-10-CM | POA: Insufficient documentation

## 2018-11-09 DIAGNOSIS — F1010 Alcohol abuse, uncomplicated: Secondary | ICD-10-CM | POA: Insufficient documentation

## 2018-11-09 DIAGNOSIS — I160 Hypertensive urgency: Secondary | ICD-10-CM | POA: Insufficient documentation

## 2018-11-09 DIAGNOSIS — E876 Hypokalemia: Secondary | ICD-10-CM | POA: Insufficient documentation

## 2018-11-09 DIAGNOSIS — K760 Fatty (change of) liver, not elsewhere classified: Secondary | ICD-10-CM | POA: Insufficient documentation

## 2018-11-09 LAB — COMPREHENSIVE METABOLIC PANEL
ALT: 164 U/L — ABNORMAL HIGH (ref 0–44)
ALT: 160 U/L — ABNORMAL HIGH (ref 0–44)
AST: 126 U/L — ABNORMAL HIGH (ref 15–41)
AST: 109 U/L — ABNORMAL HIGH (ref 15–41)
Albumin: 4.3 g/dL (ref 3.5–5.0)
Albumin: 4.6 g/dL (ref 3.5–5.0)
Alkaline Phosphatase: 51 U/L (ref 38–126)
Alkaline Phosphatase: 49 U/L (ref 38–126)
Anion gap: 10 (ref 5–15)
Anion gap: 11 (ref 5–15)
BUN: 17 mg/dL (ref 6–20)
BUN: 17 mg/dL (ref 6–20)
CO2: 26 mmol/L (ref 22–32)
CO2: 27 mmol/L (ref 22–32)
Calcium: 9.4 mg/dL (ref 8.9–10.3)
Calcium: 9.7 mg/dL (ref 8.9–10.3)
Chloride: 101 mmol/L (ref 98–111)
Chloride: 98 mmol/L (ref 98–111)
Creatinine, Ser: 1.24 mg/dL (ref 0.61–1.24)
Creatinine, Ser: 1.29 mg/dL — ABNORMAL HIGH (ref 0.61–1.24)
GFR calc Af Amer: >60 mL/min (ref >60)
GFR calc Af Amer: >60 mL/min (ref >60)
GFR calc non Af Amer: >60 mL/min (ref >60)
GFR calc non Af Amer: >60 mL/min (ref >60)
Glucose, Bld: 131 mg/dL — ABNORMAL HIGH (ref 70–99)
Glucose, Bld: 143 mg/dL — ABNORMAL HIGH (ref 70–99)
Potassium: 3.2 mmol/L — ABNORMAL LOW (ref 3.5–5.1)
Potassium: 3.7 mmol/L (ref 3.5–5.1)
Sodium: 137 mmol/L (ref 135–145)
Sodium: 136 mmol/L (ref 135–145)
Total Bilirubin: 2.1 mg/dL — ABNORMAL HIGH (ref 0.3–1.2)
Total Bilirubin: 1.9 mg/dL — ABNORMAL HIGH (ref 0.3–1.2)
Total Protein: 8.3 g/dL — ABNORMAL HIGH (ref 6.5–8.1)
Total Protein: 8.2 g/dL — ABNORMAL HIGH (ref 6.5–8.1)

## 2018-11-09 LAB — CBC WITH DIFFERENTIAL/PLATELET
Abs Immature Granulocytes: 0.02 10*3/uL (ref 0.00–0.07)
Basophils Absolute: 0 10*3/uL (ref 0.0–0.1)
Basophils Relative: 1 %
Eosinophils Absolute: 0.1 10*3/uL (ref 0.0–0.5)
Eosinophils Relative: 2 %
HCT: 47.3 % (ref 39.0–52.0)
Hemoglobin: 16 g/dL (ref 13.0–17.0)
Immature Granulocytes: 0 %
Lymphocytes Relative: 25 %
Lymphs Abs: 1.3 10*3/uL (ref 0.7–4.0)
MCH: 30.7 pg (ref 26.0–34.0)
MCHC: 33.8 g/dL (ref 30.0–36.0)
MCV: 90.6 fL (ref 80.0–100.0)
Monocytes Absolute: 0.4 10*3/uL (ref 0.1–1.0)
Monocytes Relative: 8 %
Neutro Abs: 3.2 10*3/uL (ref 1.7–7.7)
Neutrophils Relative %: 64 %
Platelets: 240 10*3/uL (ref 150–400)
RBC: 5.22 MIL/uL (ref 4.22–5.81)
RDW: 13.2 % (ref 11.5–15.5)
WBC: 5.1 10*3/uL (ref 4.0–10.5)
nRBC: 0 % (ref 0.0–0.2)

## 2018-11-09 LAB — URINE DRUG SCREEN, QUALITATIVE (ARMC ONLY)
Amphetamines, Ur Screen: NOT DETECTED
Barbiturates, Ur Screen: NOT DETECTED
Benzodiazepine, Ur Scrn: NOT DETECTED
Cannabinoid 50 Ng, Ur ~~LOC~~: POSITIVE — AB
Cocaine Metabolite,Ur ~~LOC~~: POSITIVE — AB
MDMA (Ecstasy)Ur Screen: NOT DETECTED
Methadone Scn, Ur: NOT DETECTED
Opiate, Ur Screen: NOT DETECTED
Phencyclidine (PCP) Ur S: NOT DETECTED
Tricyclic, Ur Screen: NOT DETECTED

## 2018-11-09 LAB — TROPONIN I: Troponin I: 0.03 ng/mL (ref <0.03)

## 2018-11-09 MED ORDER — SODIUM CHLORIDE 0.9 % IV SOLN: 250.0000 mL | INTRAVENOUS | Status: DC | PRN

## 2018-11-09 MED ORDER — HYDRALAZINE HCL 20 MG/ML IJ SOLN
15.0000 mg | INTRAMUSCULAR | Status: DC | PRN
  Administered 2018-11-09: 15 mg via INTRAVENOUS
  Filled 2018-11-09: qty 1

## 2018-11-09 MED ORDER — HYDROCHLOROTHIAZIDE 25 MG PO TABS
25.0000 mg | ORAL_TABLET | Freq: Every day | ORAL | Status: DC
  Administered 2018-11-09 – 2018-11-10 (×2): 25 mg via ORAL
  Filled 2018-11-09 (×2): qty 1

## 2018-11-09 MED ORDER — ONDANSETRON HCL 4 MG/2ML IJ SOLN: 4.0000 mg | Freq: Four times a day (QID) | INTRAMUSCULAR | Status: DC | PRN

## 2018-11-09 MED ORDER — ALBUTEROL SULFATE (2.5 MG/3ML) 0.083% IN NEBU
2.5000 mg | INHALATION_SOLUTION | Freq: Four times a day (QID) | RESPIRATORY_TRACT | Status: DC
  Filled 2018-11-09: qty 3

## 2018-11-09 MED ORDER — SODIUM CHLORIDE 0.9% FLUSH: 3.0000 mL | INTRAVENOUS | Status: DC | PRN

## 2018-11-09 MED ORDER — THIAMINE HCL 100 MG/ML IJ SOLN: 100.0000 mg | Freq: Every day | INTRAMUSCULAR | Status: DC

## 2018-11-09 MED ORDER — POLYETHYLENE GLYCOL 3350 17 G PO PACK: 17.0000 g | PACK | Freq: Every day | ORAL | Status: DC | PRN

## 2018-11-09 MED ORDER — PANTOPRAZOLE SODIUM 40 MG PO TBEC
40.0000 mg | DELAYED_RELEASE_TABLET | Freq: Every day | ORAL | Status: DC
  Administered 2018-11-09 – 2018-11-10 (×2): 40 mg via ORAL
  Filled 2018-11-09 (×2): qty 1

## 2018-11-09 MED ORDER — METOPROLOL TARTRATE 5 MG/5ML IV SOLN
5.0000 mg | Freq: Once | INTRAVENOUS | Status: AC
  Administered 2018-11-09: 5 mg via INTRAVENOUS
  Filled 2018-11-09: qty 5

## 2018-11-09 MED ORDER — HYDRALAZINE HCL 50 MG PO TABS
25.0000 mg | ORAL_TABLET | Freq: Once | ORAL | Status: AC
  Administered 2018-11-09: 25 mg via ORAL
  Filled 2018-11-09: qty 1

## 2018-11-09 MED ORDER — SODIUM CHLORIDE 0.9% FLUSH
3.0000 mL | Freq: Two times a day (BID) | INTRAVENOUS | Status: DC
  Administered 2018-11-09 – 2018-11-10 (×2): 3 mL via INTRAVENOUS

## 2018-11-09 MED ORDER — ONDANSETRON HCL 4 MG PO TABS: 4.0000 mg | ORAL_TABLET | Freq: Four times a day (QID) | ORAL | Status: DC | PRN

## 2018-11-09 MED ORDER — ACETAMINOPHEN 325 MG PO TABS
650.0000 mg | ORAL_TABLET | Freq: Four times a day (QID) | ORAL | Status: DC | PRN
  Administered 2018-11-09: 650 mg via ORAL
  Filled 2018-11-09: qty 2

## 2018-11-09 MED ORDER — OXYCODONE HCL 5 MG PO TABS: 5.0000 mg | ORAL_TABLET | ORAL | Status: DC | PRN

## 2018-11-09 MED ORDER — ADULT MULTIVITAMIN W/MINERALS CH
1.0000 | ORAL_TABLET | Freq: Every day | ORAL | Status: DC
  Administered 2018-11-09 – 2018-11-10 (×2): 1 via ORAL
  Filled 2018-11-09 (×2): qty 1

## 2018-11-09 MED ORDER — VITAMIN B-1 100 MG PO TABS
100.0000 mg | ORAL_TABLET | Freq: Every day | ORAL | Status: DC
  Administered 2018-11-09 – 2018-11-10 (×2): 100 mg via ORAL
  Filled 2018-11-09 (×2): qty 1

## 2018-11-09 MED ORDER — OXYMETAZOLINE HCL 0.05 % NA SOLN
1.0000 | Freq: Two times a day (BID) | NASAL | Status: DC | PRN
  Filled 2018-11-09: qty 15

## 2018-11-09 MED ORDER — POTASSIUM CHLORIDE 20 MEQ PO PACK
40.0000 meq | PACK | Freq: Once | ORAL | Status: AC
  Administered 2018-11-09: 40 meq via ORAL
  Filled 2018-11-09: qty 2

## 2018-11-09 MED ORDER — LORAZEPAM 2 MG/ML IJ SOLN: 1.0000 mg | Freq: Four times a day (QID) | INTRAMUSCULAR | Status: DC | PRN

## 2018-11-09 MED ORDER — ATENOLOL 50 MG PO TABS
50.0000 mg | ORAL_TABLET | Freq: Every day | ORAL | Status: DC
  Administered 2018-11-09: 50 mg via ORAL
  Filled 2018-11-09 (×2): qty 1

## 2018-11-09 MED ORDER — OXYMETAZOLINE HCL 0.05 % NA SOLN
1.0000 | Freq: Once | NASAL | Status: AC
  Administered 2018-11-09: 09:00:00 1 via NASAL
  Filled 2018-11-09: qty 30

## 2018-11-09 MED ORDER — AMLODIPINE BESYLATE 10 MG PO TABS
10.0000 mg | ORAL_TABLET | Freq: Every day | ORAL | Status: DC
  Administered 2018-11-09 – 2018-11-10 (×2): 10 mg via ORAL
  Filled 2018-11-09: qty 1
  Filled 2018-11-09: qty 2

## 2018-11-09 MED ORDER — ACETAMINOPHEN 650 MG RE SUPP: 650.0000 mg | Freq: Four times a day (QID) | RECTAL | Status: DC | PRN

## 2018-11-09 MED ORDER — SODIUM CHLORIDE 0.9% FLUSH: 3.0000 mL | Freq: Two times a day (BID) | INTRAVENOUS | Status: DC

## 2018-11-09 MED ORDER — HYDROCHLOROTHIAZIDE 25 MG PO TABS: 25.0000 mg | ORAL_TABLET | Freq: Every day | ORAL | Status: DC

## 2018-11-09 MED ORDER — LORAZEPAM 1 MG PO TABS: 1.0000 mg | ORAL_TABLET | Freq: Four times a day (QID) | ORAL | Status: DC | PRN

## 2018-11-09 MED ORDER — LABETALOL HCL 5 MG/ML IV SOLN
10.0000 mg | Freq: Once | INTRAVENOUS | Status: AC
  Administered 2018-11-09: 11:00:00 10 mg via INTRAVENOUS
  Filled 2018-11-09: qty 4

## 2018-11-09 MED ORDER — FOLIC ACID 1 MG PO TABS
1.0000 mg | ORAL_TABLET | Freq: Every day | ORAL | Status: DC
  Administered 2018-11-09 – 2018-11-10 (×2): 1 mg via ORAL
  Filled 2018-11-09 (×2): qty 1

## 2018-11-09 NOTE — ED Notes (Signed)
ED TO INPATIENT HANDOFF REPORT  ED Nurse Name and Phone #: Shanda Bumps [phone]   S Name/Age/Gender Leotis Shames A Breck 38 y.o. male Room/Bed: ED16A/ED16A  Code Status   Code Status: Prior  Home/SNF/Other Home Patient oriented to: self, place, time and situation Is this baseline? Yes   Triage Complete: Triage complete  Chief Complaint nosebleed  Triage Note Pt reports while laying in bed this morning he felt like his sinuses was draining. When he got up he noticed his nose was bleeding. Report nose bleed stopped while in ambulance. Per pt has been having some trouble getting bp under control   Allergies No Known Allergies  Level of Care/Admitting Diagnosis ED Disposition    ED Disposition Condition Comment   Admit  Hospital Area: [facility] [[id number]]  Level of Care: Telemetry [5]  Diagnosis: HTN (hypertension) [959747]  Admitting Physician: Bertrum Sol [[id number]]  Attending Physician: Bertrum Sol [[id number]]  PT Class (Do Not Modify): Observation [104]  PT Acc Code (Do Not Modify): Observation [10022]       B Medical/Surgery History No past medical history on file. No past surgical history on file.   A IV Location/Drains/Wounds Patient Lines/Drains/Airways Status   Active Line/Drains/Airways    Name:   Placement date:   Placement time:   Site:   Days:   Peripheral IV 06/20/17 Left Antecubital   06/20/17    1000    Antecubital   507   Peripheral IV 11/09/18 Left Antecubital   11/09/18    1055    Antecubital   less than 1          Intake/Output Last 24 hours No intake or output data in the 24 hours ending 11/09/18 1525  Labs/Imaging Results for orders placed or performed during the hospital encounter of 11/09/18 (from the past 48 hour(s))  CBC with Differential/Platelet     Status: None   Collection Time: 11/09/18  8:36 AM  Result Value Ref Range   WBC 5.1 4.0 - 10.5 K/uL   RBC 5.22 4.22 - 5.81 MIL/uL   Hemoglobin 16.0 13.0 - 17.0  g/dL   HCT 93.5 52.1 - 74.7 %   MCV 90.6 80.0 - 100.0 fL   MCH 30.7 26.0 - 34.0 pg   MCHC 33.8 30.0 - 36.0 g/dL   RDW 15.9 53.9 - 67.2 %   Platelets 240 150 - 400 K/uL   nRBC 0.0 0.0 - 0.2 %   Neutrophils Relative % 64 %   Neutro Abs 3.2 1.7 - 7.7 K/uL   Lymphocytes Relative 25 %   Lymphs Abs 1.3 0.7 - 4.0 K/uL   Monocytes Relative 8 %   Monocytes Absolute 0.4 0.1 - 1.0 K/uL   Eosinophils Relative 2 %   Eosinophils Absolute 0.1 0.0 - 0.5 K/uL   Basophils Relative 1 %   Basophils Absolute 0.0 0.0 - 0.1 K/uL   Immature Granulocytes 0 %   Abs Immature Granulocytes 0.02 0.00 - 0.07 K/uL    Comment: Performed at [facility], [address] [place], [place] [address]  Comprehensive metabolic panel     Status: Abnormal   Collection Time: 11/09/18  8:36 AM  Result Value Ref Range   Sodium 137 135 - 145 mmol/L   Potassium 3.2 (L) 3.5 - 5.1 mmol/L   Chloride 101 98 - 111 mmol/L   CO2 26 22 - 32 mmol/L   Glucose, Bld 131 (H) 70 - 99 mg/dL   BUN 17 6 -  20 mg/dL   Creatinine, Ser 0.981.24 0.61 - 1.24 mg/dL   Calcium 9.4 8.9 - 11.910.3 mg/dL   Total Protein 8.3 (H) 6.5 - 8.1 g/dL   Albumin 4.3 3.5 - 5.0 g/dL   AST 147126 (H) 15 - 41 U/L   ALT 164 (H) 0 - 44 U/L   Alkaline Phosphatase 51 38 - 126 U/L   Total Bilirubin 2.1 (H) 0.3 - 1.2 mg/dL   GFR calc non Af Amer >60 >60 mL/min   GFR calc Af Amer >60 >60 mL/min   Anion gap 10 5 - 15    Comment: Performed at [facility], [address] [place], [place] [address]  Troponin I - ONCE - STAT     Status: Abnormal   Collection Time: 11/09/18  8:36 AM  Result Value Ref Range   Troponin I 0.03 (HH) <0.03 ng/mL    Comment: CRITICAL RESULT CALLED TO, READ BACK BY AND VERIFIED WITH Naethan Bracewell@0932  ON 11/09/18 BY HKP Performed at [facility], [address] [place], [place] [address]    No results found.  Pending Labs [employer]ed Labs (From admission, onward)    Start     Ordered   Signed and Held  HIV  antibody (Routine Testing)  Once,   R     Signed and Held   Signed and Held  Hepatitis panel, acute  Once,   R     Signed and Held   Medical illustratorigned and Held  Comprehensive metabolic panel  Once,   R     Signed and Held   Signed and Held  Urine drugs of abuse scrn w alc, routine (Ref Lab)  Once,   R     Signed and Held   Signed and Held  Magnesium  Tomorrow morning,   R     Signed and Held          Vitals/Pain Today's Vitals   11/09/18 1342 11/09/18 1343 11/09/18 1344 11/09/18 1345  BP:      Pulse:      Resp: 17 (!) 21 20 (!) 28  Temp:      TempSrc:      SpO2:      Weight:      Height:        Isolation Precautions No active isolations  Medications Medications  hydrochlorothiazide (HYDRODIURIL) tablet 25 mg (25 mg Oral Not Given 11/09/18 1020)  hydrALAZINE (APRESOLINE) injection 15 mg (15 mg Intravenous Given 11/09/18 1315)  amLODipine (NORVASC) tablet 10 mg (10 mg Oral Given 11/09/18 1316)  LORazepam (ATIVAN) tablet 1 mg (has no administration in time range)    Or  LORazepam (ATIVAN) injection 1 mg (has no administration in time range)  thiamine (VITAMIN B-1) tablet 100 mg (has no administration in time range)    Or  thiamine (B-1) injection 100 mg (has no administration in time range)  folic acid (FOLVITE) tablet 1 mg (has no administration in time range)  multivitamin with minerals tablet 1 tablet (has no administration in time range)  oxymetazoline (AFRIN) 0.05 % nasal spray 1 spray (1 spray Each Nare Given 11/09/18 0856)  hydrALAZINE (APRESOLINE) tablet 25 mg (25 mg Oral Given 11/09/18 1011)  labetalol (NORMODYNE,TRANDATE) injection 10 mg (10 mg Intravenous Given 11/09/18 1101)  metoprolol tartrate (LOPRESSOR) injection 5 mg (5 mg Intravenous Given 11/09/18 1138)    Mobility walks Low fall risk   Focused Assessments 1   R Recommendations: See Admitting Provider Note  Report given to:  Additional Notes:

## 2018-11-09 NOTE — ED Notes (Signed)
Pt nose began to bleed, given ice pack, advised not to blow the nose and to lay the head back

## 2018-11-09 NOTE — ED Triage Notes (Signed)
Pt reports while laying in bed this morning he felt like his sinuses was draining. When he got up he noticed his nose was bleeding. Report nose bleed stopped while in ambulance. Per pt has been having some trouble getting bp under control

## 2018-11-09 NOTE — H&P (Signed)
[facility] [place] [facility]   PATIENT NAME: Danny Weeks    MR#:  [id number]  DATE OF BIRTH:  May 29, 1981  DATE OF ADMISSION:  11/09/2018  PRIMARY CARE PHYSICIAN: Patient, No Pcp Per   REQUESTING/REFERRING PHYSICIAN:   CHIEF COMPLAINT:   Chief Complaint  Patient presents with  . Epistaxis    HISTORY OF PRESENT ILLNESS: Danny Weeks  is a 38 y.o. male with a known history per below, awoke with nasal congestion, nosebleed, and emergency room patient was found to have acute accelerated hypertension, potassium 3.2, AST 126, ALT 164, noted blood pressure greater than 200 systolically, mild tachycardia, patient evaluated emergency room, epistaxis has resolved, patient states that he does not take medication for hypertension/never followed up, patient admits to drinking large quantities of alcohol without history of DUI/arrest, patient is now been admitted for acute accelerated hypertension with epistaxis.  PAST MEDICAL HISTORY:   Hypertension Allergic rhinitis  PAST SURGICAL HISTORY:  None  SOCIAL HISTORY:  Social History   Tobacco Use  . Smoking status: Current Every Day Smoker    Packs/day: 1.00    Types: Cigarettes  . Smokeless tobacco: Never Used  Substance Use Topics  . Alcohol use: Yes    FAMILY HISTORY:  Hypertension  DRUG ALLERGIES: No Known Allergies  REVIEW OF SYSTEMS:   CONSTITUTIONAL: No fever, fatigue or weakness.  EYES: No blurred or double vision.  EARS, NOSE, AND THROAT: No tinnitus or ear pain. + Nosebleed RESPIRATORY: No cough, shortness of breath, wheezing or hemoptysis.  CARDIOVASCULAR: No chest pain, orthopnea, edema.  GASTROINTESTINAL: No nausea, vomiting, diarrhea or abdominal pain.  GENITOURINARY: No dysuria, hematuria.  ENDOCRINE: No polyuria, nocturia,  HEMATOLOGY: No anemia, easy bruising or bleeding SKIN: No rash or lesion. MUSCULOSKELETAL: No joint pain or arthritis.   NEUROLOGIC: No tingling, numbness, weakness.   PSYCHIATRY: No anxiety or depression.   MEDICATIONS AT HOME:  Prior to Admission medications   Medication Sig Start Date End Date Taking? Authorizing Provider  multivitamin (ONE-A-DAY MEN'S) TABS tablet Take 1 tablet by mouth daily.   Yes [provider]  omeprazole (PRILOSEC OTC) 20 MG tablet Take 20 mg by mouth daily.   Yes [provider]      PHYSICAL EXAMINATION:   VITAL SIGNS: Blood pressure (!) 206/127, pulse 91, temperature 98.5 F (36.9 C), temperature source Oral, resp. rate (!) 21, height 6\' 2"  (1.88 m), weight 120.2 kg, SpO2 94 %.  GENERAL:  38 y.o.-year-old patient lying in the bed with no acute distress.  EYES: Pupils equal, round, reactive to light and accommodation. No scleral icterus. Extraocular muscles intact.  HEENT: Head atraumatic, normocephalic. Oropharynx and nasopharynx clear.  NECK:  Supple, no jugular venous distention. No thyroid enlargement, no tenderness.  LUNGS: Normal breath sounds bilaterally, no wheezing, rales,rhonchi or crepitation. No use of accessory muscles of respiration.  CARDIOVASCULAR: S1, S2 normal. No murmurs, rubs, or gallops.  ABDOMEN: Soft, nontender, nondistended. Bowel sounds present. No organomegaly or mass.  EXTREMITIES: No pedal edema, cyanosis, or clubbing.  NEUROLOGIC: Cranial nerves II through XII are intact. Muscle strength 5/5 in all extremities. Sensation intact. Gait not checked.  PSYCHIATRIC: The patient is alert and oriented x 3.  SKIN: No obvious rash, lesion, or ulcer.   LABORATORY PANEL:   CBC Recent Labs  Lab 11/09/18 0836  WBC 5.1  HGB 16.0  HCT 47.3  PLT 240  MCV 90.6  MCH 30.7  MCHC 33.8  RDW 13.2  LYMPHSABS 1.3  MONOABS 0.4  EOSABS 0.1  BASOSABS 0.0   ------------------------------------------------------------------------------------------------------------------  Chemistries  Recent Labs  Lab 11/09/18 0836  NA 137  K 3.2*  CL 101  CO2 26  GLUCOSE 131*  BUN 17   CREATININE 1.24  CALCIUM 9.4  AST 126*  ALT 164*  ALKPHOS 51  BILITOT 2.1*   ------------------------------------------------------------------------------------------------------------------ estimated creatinine clearance is 111.3 mL/min (by C-G formula based on SCr of 1.24 mg/dL). ------------------------------------------------------------------------------------------------------------------ No results for input(s): TSH, T4TOTAL, T3FREE, THYROIDAB in the last 72 hours.  Invalid input(s): FREET3   Coagulation profile No results for input(s): INR, PROTIME in the last 168 hours. ------------------------------------------------------------------------------------------------------------------- No results for input(s): DDIMER in the last 72 hours. -------------------------------------------------------------------------------------------------------------------  Cardiac Enzymes Recent Labs  Lab 11/09/18 0836  TROPONINI 0.03*   ------------------------------------------------------------------------------------------------------------------ Invalid input(s): POCBNP  ---------------------------------------------------------------------------------------------------------------  Urinalysis No results found for: COLORURINE, APPEARANCEUR, LABSPEC, PHURINE, GLUCOSEU, HGBUR, BILIRUBINUR, KETONESUR, PROTEINUR, UROBILINOGEN, NITRITE, LEUKOCYTESUR   RADIOLOGY: No results found.  EKG: Orders placed or performed during the hospital encounter of 11/09/18  . ED EKG  . ED EKG    IMPRESSION AND PLAN: *Acute accelerated hypertension With epistaxis Admit to telemetry bed, start Norvasc daily, hydrochlorothiazide, atenolol, PRN hydralazine, vitals per routine, make changes as per necessary, low-sodium diet, education given at the bedside  *Acute epistaxis Secondary to above Resolved  *Acute hypokalemia Replete with potassium, BMP in the morning, check magnesium level  *Acute  elevated liver function tests Most likely secondary to alcohol consumption Check hepatitis panel, liver ultrasound, CMP in the morning  *Chronic alcohol abuse Alcohol withdrawal protocol while in house  Discharge tomorrow if blood pressures controlled   All the records are reviewed and case discussed with ED provider. Management plans discussed with the patient, family and they are in agreement.  CODE STATUS:full Code Status History    Date Active Date Inactive Code Status Order ID Comments User Context   06/20/2017 1809 06/23/2017 1752 Full Code [id number]  Marguarite Arbour, MD ED       TOTAL TIME TAKING CARE OF THIS PATIENT: 40 minutes.    Evelena Asa Salary M.D on 11/09/2018   Between 7am to 6pm - Pager - [phone]  After 6pm go to [email or web address] - password [email or web address]  [facility] [place]  Office  [phone]  CC: Primary care physician; Patient, No Pcp Per   Note: This dictation was prepared with Dragon dictation along with smaller phrase technology. Any transcriptional errors that result from this process are unintentional.

## 2018-11-09 NOTE — ED Notes (Signed)
Dr Mayford Knife notified in person pts troponin 0.03. md to bedside. See orders.

## 2018-11-09 NOTE — ED Provider Notes (Addendum)
[facility] Emergency Department Provider Note       Time seen: ----------------------------------------- 8:23 AM on 11/09/2018 -----------------------------------------   I have reviewed the triage vital signs and the nursing notes.  HISTORY   Chief Complaint Epistaxis    HPI Danny Weeks is a 38 y.o. male with no significant past medical history who presents to the ED for epistaxis and hypertension.  Patient states he woke up he was having intermittent nosebleeds which he has never had before.  He has had seasonal allergies.  He denies fevers, chills, chest pain, shortness of breath, vomiting or diarrhea.  Blood pressure was noted to be markedly elevated.  He has not been diagnosed with hypertension in the past according to him.  No past medical history on file.  Patient Active Problem List   Diagnosis Date Noted  . Nausea vomiting and diarrhea   . Abdominal pain 06/20/2017  . Intractable nausea and vomiting 06/20/2017  . Tachycardia 06/20/2017  . Accelerated hypertension 06/20/2017    No past surgical history on file.  Allergies Patient has no known allergies.  Social History Social History   Tobacco Use  . Smoking status: Current Every Day Smoker    Packs/day: 1.00    Types: Cigarettes  . Smokeless tobacco: Never Used  Substance Use Topics  . Alcohol use: Yes  . Drug use: No   Review of Systems Constitutional: Negative for fever. HEENT: Positive for epistaxis Cardiovascular: Negative for chest pain. Respiratory: Negative for shortness of breath. Gastrointestinal: Negative for abdominal pain, vomiting and diarrhea. Musculoskeletal: Negative for back pain. Skin: Negative for rash. Neurological: Negative for headaches, focal weakness or numbness.  All systems negative/normal/unremarkable except as stated in the HPI  ____________________________________________   PHYSICAL EXAM:  VITAL SIGNS: ED Triage Vitals  Enc Vitals  Group     BP      Pulse      Resp      Temp      Temp src      SpO2      Weight      Height      Head Circumference      Peak Flow      Pain Score      Pain Loc      Pain Edu?      Excl. in GC?    Constitutional: Alert and oriented. Well appearing and in no distress. Eyes: Conjunctivae are normal. Normal extraocular movements. ENT      Head: Normocephalic and atraumatic.      Nose: Recent bleeding from the left naris      Mouth/Throat: Mucous membranes are moist.      Neck: No stridor. Cardiovascular: Normal rate, regular rhythm. No murmurs, rubs, or gallops. Respiratory: Normal respiratory effort without tachypnea nor retractions. Breath sounds are clear and equal bilaterally. No wheezes/rales/rhonchi. Gastrointestinal: Soft and nontender. Normal bowel sounds Musculoskeletal: Nontender with normal range of motion in extremities. No lower extremity tenderness nor edema. Neurologic:  Normal speech and language. No gross focal neurologic deficits are appreciated.  Skin:  Skin is warm, dry and intact. No rash noted. Psychiatric: Mood and affect are normal. Speech and behavior are normal.  ____________________________________________  EKG: Interpreted by me.  Sinus rhythm with a rate of 96 bpm, normal axis,, normal QT  ____________________________________________  ED COURSE:  As part of my medical decision making, I reviewed the following data within the electronic MEDICAL RECORD NUMBER History obtained from family if available, nursing notes,  old chart and ekg, as well as notes from prior ED visits. Patient presented for epistaxis and hypertension, we will assess with labs as indicated at this time.   Procedures  Leotis ShamesJeffery A Dales was evaluated in Emergency Department on 11/09/2018 for the symptoms described in the history of present illness. He was evaluated in the context of the global COVID-19 pandemic, which necessitated consideration that the patient might be at risk for infection  with the SARS-CoV-2 virus that causes COVID-19. Institutional protocols and algorithms that pertain to the evaluation of patients at risk for COVID-19 are in a state of rapid change based on information released by regulatory bodies including the CDC and federal and state organizations. These policies and algorithms were followed during the patient's care in the ED.  ____________________________________________   LABS (pertinent positives/negatives)  Labs Reviewed  COMPREHENSIVE METABOLIC PANEL - Abnormal; Notable for the following components:      Result Value   Potassium 3.2 (*)    Glucose, Bld 131 (*)    Total Protein 8.3 (*)    AST 126 (*)    ALT 164 (*)    Total Bilirubin 2.1 (*)    All other components within normal limits  TROPONIN I - Abnormal; Notable for the following components:   Troponin I 0.03 (*)    All other components within normal limits  CBC WITH DIFFERENTIAL/PLATELET  ____________________________________________   DIFFERENTIAL DIAGNOSIS   Hypertension, epistaxis, medication noncompliance, seasonal allergy, renal failure  FINAL ASSESSMENT AND PLAN  Epistaxis, hypertensive urgency   Plan: The patient had presented for epistaxis and hypertension. Patient's labs did reveal an elevated troponin and I discussed his lab results with him.  He has initially refused hospital admission but agreed when his nose started rebleeding.  I have started him on HCTZ and hydralazine.  I will discuss with the hospitalist for admission at this time.   Ulice DashJohnathan E Maejor Erven, MD    Note: This note was generated in part or whole with voice recognition software. Voice recognition is usually quite accurate but there are transcription errors that can and very often do occur. I apologize for any typographical errors that were not detected and corrected.     Emily FilbertWilliams, Angeli Demilio E, MD 11/09/18 [id number]    Emily FilbertWilliams, Barre Aydelott E, MD 11/09/18 1028

## 2018-11-09 NOTE — ED Notes (Signed)
Dr. Katheren Shams updated on pts BP 159/109. Dr. Katheren Shams said pts BP was markedly improved and was good to go to the floor.

## 2018-11-10 ENCOUNTER — Observation Stay: Payer: Self-pay

## 2018-11-10 LAB — MAGNESIUM: Magnesium: 2.2 mg/dL (ref 1.7–2.4)

## 2018-11-10 MED ORDER — AMLODIPINE BESYLATE 10 MG PO TABS: 10.0000 mg | ORAL_TABLET | Freq: Every day | ORAL | 3 refills | Status: DC

## 2018-11-10 MED ORDER — HYDROCHLOROTHIAZIDE 25 MG PO TABS: 25.0000 mg | ORAL_TABLET | Freq: Every day | ORAL | 3 refills | Status: DC

## 2018-11-10 MED ORDER — ALBUTEROL SULFATE (2.5 MG/3ML) 0.083% IN NEBU: 2.5000 mg | INHALATION_SOLUTION | Freq: Four times a day (QID) | RESPIRATORY_TRACT | Status: DC | PRN

## 2018-11-10 NOTE — Progress Notes (Signed)
Discharge instructions explained to pt/ verbalized an understanding/ iv and tele removed/ RX given to pt/ will transport off unit via wheelchair.  

## 2018-11-10 NOTE — Plan of Care (Signed)
No signs of detox. Patient needs a primary care physician. Continue to educate about medication adherence and annual physicals.

## 2018-11-10 NOTE — Discharge Summary (Signed)
[facility] [place] at [facility]   PATIENT NAME: Danny Weeks    MR#:  [id number]  DATE OF BIRTH:  1980-10-04  DATE OF ADMISSION:  11/09/2018 ADMITTING PHYSICIAN: Bertrum Sol, MD  DATE OF DISCHARGE: No discharge date for patient encounter.  PRIMARY CARE PHYSICIAN: Patient, No Pcp Per    ADMISSION DIAGNOSIS:  Epistaxis [R04.0] Hypertensive urgency [I16.0]  DISCHARGE DIAGNOSIS:  Active Problems:   HTN (hypertension)   SECONDARY DIAGNOSIS:  History reviewed. No pertinent past medical history.  HOSPITAL COURSE:  *Acute accelerated hypertension With epistaxis Resolved on current regiment, patient given education while in house, will have patient follow-up/establish care with local primary care provider in 3 to 5 days for reevaluation of blood pressure status post discharge   *Acute epistaxis Secondary to above Resolved  *Acute polysubstance abuse Urine drug screen noted for cocaine/marijuana Education given while in house Cessation recommended  *Acute hypokalemia Repleted  *Acute elevated liver function tests Stable Most likely secondary to alcohol consumption Liver ultrasound noted for hepatic steatosis, hepatitis panel pending at discharge-we will have patient follow-up with primary care provider to follow-up on status post discharge   *Chronic alcohol abuse Stable Alcohol withdrawal protocol while in house was provided, provided education while in house, cessation recommended   DISCHARGE CONDITIONS:   stable  CONSULTS OBTAINED:    DRUG ALLERGIES:  No Known Allergies  DISCHARGE MEDICATIONS:   Allergies as of 11/10/2018   No Known Allergies     Medication List    TAKE these medications   amLODipine 10 MG tablet Commonly known as:  NORVASC Take 1 tablet (10 mg total) by mouth daily. Start taking on:  November 11, 2018   hydrochlorothiazide 25 MG tablet Commonly known as:  HYDRODIURIL Take 1 tablet (25 mg total)  by mouth daily. Start taking on:  November 11, 2018   multivitamin Tabs tablet Take 1 tablet by mouth daily.   omeprazole 20 MG tablet Commonly known as:  PRILOSEC OTC Take 20 mg by mouth daily.        DISCHARGE INSTRUCTIONS:      If you experience worsening of your admission symptoms, develop shortness of breath, life threatening emergency, suicidal or homicidal thoughts you must seek medical attention immediately by calling 911 or calling your MD immediately  if symptoms less severe.  You Must read complete instructions/literature along with all the possible adverse reactions/side effects for all the Medicines you take and that have been prescribed to you. Take any new Medicines after you have completely understood and accept all the possible adverse reactions/side effects.   Please note  You were cared for by a hospitalist during your hospital stay. If you have any questions about your discharge medications or the care you received while you were in the hospital after you are discharged, you can call the unit and asked to speak with the hospitalist on call if the hospitalist that took care of you is not available. Once you are discharged, your primary care physician will handle any further medical issues. Please note that NO REFILLS for any discharge medications will be authorized once you are discharged, as it is imperative that you return to your primary care physician (or establish a relationship with a primary care physician if you do not have one) for your aftercare needs so that they can reassess your need for medications and monitor your lab values.    Today   CHIEF COMPLAINT:   Chief Complaint  Patient presents  with  . Epistaxis    HISTORY OF PRESENT ILLNESS:   38 y.o. male with a known history per below, awoke with nasal congestion, nosebleed, and emergency room patient was found to have acute accelerated hypertension, potassium 3.2, AST 126, ALT 164, noted blood  pressure greater than 200 systolically, mild tachycardia, patient evaluated emergency room, epistaxis has resolved, patient states that he does not take medication for hypertension/never followed up, patient admits to drinking large quantities of alcohol without history of DUI/arrest, patient is now been admitted for acute accelerated hypertension with epistaxis.  VITAL SIGNS:  Blood pressure (!) 152/80, pulse 81, temperature 98.4 F (36.9 C), resp. rate 18, height 6\' 2"  (1.88 m), weight 117.5 kg, SpO2 100 %.  I/O:    Intake/Output Summary (Last 24 hours) at 11/10/2018 1038 Last data filed at 11/10/2018 0428 Gross per 24 hour  Intake 480 ml  Output 250 ml  Net 230 ml    PHYSICAL EXAMINATION:  GENERAL:  38 y.o.-year-old patient lying in the bed with no acute distress.  EYES: Pupils equal, round, reactive to light and accommodation. No scleral icterus. Extraocular muscles intact.  HEENT: Head atraumatic, normocephalic. Oropharynx and nasopharynx clear.  NECK:  Supple, no jugular venous distention. No thyroid enlargement, no tenderness.  LUNGS: Normal breath sounds bilaterally, no wheezing, rales,rhonchi or crepitation. No use of accessory muscles of respiration.  CARDIOVASCULAR: S1, S2 normal. No murmurs, rubs, or gallops.  ABDOMEN: Soft, non-tender, non-distended. Bowel sounds present. No organomegaly or mass.  EXTREMITIES: No pedal edema, cyanosis, or clubbing.  NEUROLOGIC: Cranial nerves II through XII are intact. Muscle strength 5/5 in all extremities. Sensation intact. Gait not checked.  PSYCHIATRIC: The patient is alert and oriented x 3.  SKIN: No obvious rash, lesion, or ulcer.   DATA REVIEW:   CBC Recent Labs  Lab 11/09/18 0836  WBC 5.1  HGB 16.0  HCT 47.3  PLT 240    Chemistries  Recent Labs  Lab 11/09/18 1717 11/10/18 0311  NA 136  --   K 3.7  --   CL 98  --   CO2 27  --   GLUCOSE 143*  --   BUN 17  --   CREATININE 1.29*  --   CALCIUM 9.7  --   MG  --  2.2   AST 109*  --   ALT 160*  --   ALKPHOS 49  --   BILITOT 1.9*  --     Cardiac Enzymes Recent Labs  Lab 11/09/18 0836  TROPONINI 0.03*    Microbiology Results  Results for orders placed or performed during the hospital encounter of 06/20/17  Gastrointestinal Panel by PCR , Stool     Status: Abnormal   Collection Time: 06/20/17  2:14 PM  Result Value Ref Range Status   Campylobacter species NOT DETECTED NOT DETECTED Final   Plesimonas shigelloides NOT DETECTED NOT DETECTED Final   Salmonella species DETECTED (A) NOT DETECTED Final    Comment: RESULT CALLED TO, READ BACK BY AND VERIFIED WITH: JAY BROOKS 06/20/17 @ 1618  MLK    Yersinia enterocolitica NOT DETECTED NOT DETECTED Final   Vibrio species NOT DETECTED NOT DETECTED Final   Vibrio cholerae NOT DETECTED NOT DETECTED Final   Enteroaggregative E coli (EAEC) NOT DETECTED NOT DETECTED Final   Enteropathogenic E coli (EPEC) NOT DETECTED NOT DETECTED Final   Enterotoxigenic E coli (ETEC) NOT DETECTED NOT DETECTED Final   Shiga like toxin producing E coli (STEC) NOT DETECTED NOT DETECTED  Final   Shigella/Enteroinvasive E coli (EIEC) NOT DETECTED NOT DETECTED Final   Cryptosporidium NOT DETECTED NOT DETECTED Final   Cyclospora cayetanensis NOT DETECTED NOT DETECTED Final   Entamoeba histolytica NOT DETECTED NOT DETECTED Final   Giardia lamblia NOT DETECTED NOT DETECTED Final   Adenovirus F40/41 NOT DETECTED NOT DETECTED Final   Astrovirus NOT DETECTED NOT DETECTED Final   Norovirus GI/GII NOT DETECTED NOT DETECTED Final   Rotavirus A NOT DETECTED NOT DETECTED Final   Sapovirus (I, II, IV, and V) NOT DETECTED NOT DETECTED Final  Culture, blood (routine x 2)     Status: None   Collection Time: 06/20/17  2:39 PM  Result Value Ref Range Status   Specimen Description BLOOD LEFT ANTECUBITAL  Final   Special Requests   Final    BOTTLES DRAWN AEROBIC AND ANAEROBIC Blood Culture adequate volume   Culture NO GROWTH 5 DAYS  Final    Report Status 06/25/2017 FINAL  Final  Culture, blood (routine x 2)     Status: None   Collection Time: 06/20/17  2:40 PM  Result Value Ref Range Status   Specimen Description BLOOD BLOOD RIGHT FOREARM  Final   Special Requests Blood Culture adequate volume  Final   Culture NO GROWTH 5 DAYS  Final   Report Status 06/25/2017 FINAL  Final    RADIOLOGY:  US Abdomen Limited Ruq  Result Date: 11/10/2018 CLINICAL DATA:  Hepatitis. EXAM: ULTRASOUND ABDOMEN LIMITED RIGHT UPPER QUADRANT COMPARISON:  CT scan of June 20, 2017. FINDINGS: Gallbladder: No gallstones or wall thickening visualized. No sonographic Murphy sign noted by sonographer. Common bile duct: Diameter: 3 mm which is within normal limits. Liver: No focal lesion identified. Increased echogenicity of hepatic parenchyma is noted consistent with hepatic steatosis. Portal vein is patent on color Doppler imaging with normal direction of blood flow towards the liver. IMPRESSION: Hepatic steatosis. No other abnormality seen in the right upper quadrant of the abdomen. Electronically Signed   By: Lupita Raider M.D.   On: 11/10/2018 09:05    EKG:   Orders placed or performed during the hospital encounter of 11/09/18  . ED EKG  . ED EKG      Management plans discussed with the patient, family and they are in agreement.  CODE STATUS:     Code Status Orders  (From admission, onward)         Start     Ordered   11/09/18 1646  Full code  Continuous     11/09/18 1645        Code Status History    Date Active Date Inactive Code Status Order ID Comments User Context   06/20/2017 1809 06/23/2017 1752 Full Code [id number]  Marguarite Arbour, MD ED      TOTAL TIME TAKING CARE OF THIS PATIENT: 35 minutes.    Evelena Asa Salary M.D on 11/10/2018 at 10:38 AM  Between 7am to 6pm - Pager - [phone]  After 6pm go to [email or web address] - password [email or web address]  [facility] [place] [facility]  Office  [phone]  CC: Primary care  physician; Patient, No Pcp Per   Note: This dictation was prepared with Dragon dictation along with smaller phrase technology. Any transcriptional errors that result from this process are unintentional.

## 2018-11-11 LAB — HEPATITIS PANEL, ACUTE
HCV Ab: <0.1 s/co ratio (ref 0.0–0.9)
Hep A IgM: NEGATIVE
Hep B C IgM: NEGATIVE
Hepatitis B Surface Ag: NEGATIVE

## 2018-11-11 LAB — HIV ANTIBODY (ROUTINE TESTING W REFLEX): HIV Screen 4th Generation wRfx: NONREACTIVE

## 2019-08-30 ENCOUNTER — Emergency Department: Payer: Self-pay

## 2019-08-30 ENCOUNTER — Encounter: Payer: Self-pay | Admitting: Emergency Medicine

## 2019-08-30 ENCOUNTER — Other Ambulatory Visit: Payer: Self-pay

## 2019-08-30 ENCOUNTER — Emergency Department
Admission: EM | Admit: 2019-08-30 | Discharge: 2019-08-31 | Disposition: A | Discharge status: Other Acute Inpatient Hospital | Payer: Self-pay | Attending: Emergency Medicine | Admitting: Emergency Medicine

## 2019-08-30 DIAGNOSIS — Z79899 Other long term (current) drug therapy: Secondary | ICD-10-CM | POA: Diagnosis not present

## 2019-08-30 DIAGNOSIS — Z20822 Contact with and (suspected) exposure to covid-19: Secondary | ICD-10-CM | POA: Diagnosis not present

## 2019-08-30 DIAGNOSIS — F17210 Nicotine dependence, cigarettes, uncomplicated: Secondary | ICD-10-CM | POA: Insufficient documentation

## 2019-08-30 DIAGNOSIS — Y999 Unspecified external cause status: Secondary | ICD-10-CM | POA: Diagnosis not present

## 2019-08-30 DIAGNOSIS — I10 Essential (primary) hypertension: Secondary | ICD-10-CM | POA: Insufficient documentation

## 2019-08-30 DIAGNOSIS — Y929 Unspecified place or not applicable: Secondary | ICD-10-CM | POA: Insufficient documentation

## 2019-08-30 DIAGNOSIS — S22000A Wedge compression fracture of unspecified thoracic vertebra, initial encounter for closed fracture: Secondary | ICD-10-CM

## 2019-08-30 DIAGNOSIS — Y939 Activity, unspecified: Secondary | ICD-10-CM | POA: Diagnosis not present

## 2019-08-30 DIAGNOSIS — S299XXA Unspecified injury of thorax, initial encounter: Secondary | ICD-10-CM | POA: Diagnosis present

## 2019-08-30 DIAGNOSIS — R1012 Left upper quadrant pain: Secondary | ICD-10-CM | POA: Diagnosis not present

## 2019-08-30 DIAGNOSIS — S2242XA Multiple fractures of ribs, left side, initial encounter for closed fracture: Secondary | ICD-10-CM | POA: Insufficient documentation

## 2019-08-30 DIAGNOSIS — S22050A Wedge compression fracture of T5-T6 vertebra, initial encounter for closed fracture: Secondary | ICD-10-CM | POA: Insufficient documentation

## 2019-08-30 LAB — COMPREHENSIVE METABOLIC PANEL
ALT: 217 U/L — ABNORMAL HIGH (ref 0–44)
AST: 231 U/L — ABNORMAL HIGH (ref 15–41)
Albumin: 4.5 g/dL (ref 3.5–5.0)
Alkaline Phosphatase: 59 U/L (ref 38–126)
Anion gap: 15 (ref 5–15)
BUN: 13 mg/dL (ref 6–20)
CO2: 29 mmol/L (ref 22–32)
Calcium: 9.6 mg/dL (ref 8.9–10.3)
Chloride: 96 mmol/L — ABNORMAL LOW (ref 98–111)
Creatinine, Ser: 1.78 mg/dL — ABNORMAL HIGH (ref 0.61–1.24)
GFR calc Af Amer: 54 mL/min — ABNORMAL LOW (ref >60)
GFR calc non Af Amer: 47 mL/min — ABNORMAL LOW (ref >60)
Glucose, Bld: 152 mg/dL — ABNORMAL HIGH (ref 70–99)
Potassium: 2.9 mmol/L — ABNORMAL LOW (ref 3.5–5.1)
Sodium: 140 mmol/L (ref 135–145)
Total Bilirubin: 1 mg/dL (ref 0.3–1.2)
Total Protein: 8.3 g/dL — ABNORMAL HIGH (ref 6.5–8.1)

## 2019-08-30 LAB — CBC WITH DIFFERENTIAL/PLATELET
Abs Immature Granulocytes: 0.5 10*3/uL — ABNORMAL HIGH (ref 0.00–0.07)
Basophils Absolute: 0.1 10*3/uL (ref 0.0–0.1)
Basophils Relative: 1 %
Eosinophils Absolute: 0 10*3/uL (ref 0.0–0.5)
Eosinophils Relative: 0 %
HCT: 47.9 % (ref 39.0–52.0)
Hemoglobin: 16 g/dL (ref 13.0–17.0)
Immature Granulocytes: 5 %
Lymphocytes Relative: 15 %
Lymphs Abs: 1.6 10*3/uL (ref 0.7–4.0)
MCH: 30.6 pg (ref 26.0–34.0)
MCHC: 33.4 g/dL (ref 30.0–36.0)
MCV: 91.6 fL (ref 80.0–100.0)
Monocytes Absolute: 0.6 10*3/uL (ref 0.1–1.0)
Monocytes Relative: 6 %
Neutro Abs: 7.8 10*3/uL — ABNORMAL HIGH (ref 1.7–7.7)
Neutrophils Relative %: 73 %
Platelets: 283 10*3/uL (ref 150–400)
RBC: 5.23 MIL/uL (ref 4.22–5.81)
RDW: 13.6 % (ref 11.5–15.5)
WBC: 10.6 10*3/uL — ABNORMAL HIGH (ref 4.0–10.5)
nRBC: 0.2 % (ref 0.0–0.2)

## 2019-08-30 LAB — ETHANOL: Alcohol, Ethyl (B): 259 mg/dL — ABNORMAL HIGH (ref <10)

## 2019-08-30 MED ORDER — IOHEXOL 300 MG/ML  SOLN
125.0000 mL | Freq: Once | INTRAMUSCULAR | Status: AC | PRN
  Administered 2019-08-30: 125 mL via INTRAVENOUS
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
## 2019-08-30 NOTE — ED Notes (Signed)
Pt to CT
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
## 2019-08-31 LAB — TYPE AND SCREEN
ABO/RH(D): A POS
Antibody Screen: NEGATIVE

## 2019-08-31 LAB — RESPIRATORY PANEL BY RT PCR (FLU A&B, COVID)
Influenza A by PCR: NEGATIVE
Influenza B by PCR: NEGATIVE
SARS Coronavirus 2 by RT PCR: NEGATIVE

## 2019-08-31 MED ORDER — MORPHINE SULFATE (PF) 2 MG/ML IV SOLN
2.0000 mg | Freq: Once | INTRAVENOUS | Status: AC
  Administered 2019-08-31: 2 mg via INTRAVENOUS
  Filled 2019-08-31: qty 1

## 2019-08-31 MED ORDER — SODIUM CHLORIDE 0.9 % IV BOLUS
1000.0000 mL | Freq: Once | INTRAVENOUS | Status: AC
  Administered 2019-08-31: 1000 mL via INTRAVENOUS

## 2019-08-31 NOTE — ED Notes (Signed)
[facility] and Medical Necessity documentation reviewed at this time and found to be complete per policy. 

## 2019-08-31 NOTE — ED Notes (Signed)
Lab at bedside redrawing labs.

## 2019-09-23 ENCOUNTER — Emergency Department
Admission: EM | Admit: 2019-09-23 | Discharge: 2019-09-23 | Disposition: A | Discharge status: Home / Self Care | Payer: Self-pay | Attending: Emergency Medicine | Admitting: Emergency Medicine

## 2019-09-23 ENCOUNTER — Emergency Department: Payer: Self-pay

## 2019-09-23 ENCOUNTER — Other Ambulatory Visit: Payer: Self-pay

## 2019-09-23 DIAGNOSIS — S51812A Laceration without foreign body of left forearm, initial encounter: Secondary | ICD-10-CM | POA: Insufficient documentation

## 2019-09-23 DIAGNOSIS — F17210 Nicotine dependence, cigarettes, uncomplicated: Secondary | ICD-10-CM | POA: Insufficient documentation

## 2019-09-23 DIAGNOSIS — Z79899 Other long term (current) drug therapy: Secondary | ICD-10-CM | POA: Insufficient documentation

## 2019-09-23 DIAGNOSIS — Z23 Encounter for immunization: Secondary | ICD-10-CM | POA: Insufficient documentation

## 2019-09-23 DIAGNOSIS — Y999 Unspecified external cause status: Secondary | ICD-10-CM | POA: Insufficient documentation

## 2019-09-23 DIAGNOSIS — I10 Essential (primary) hypertension: Secondary | ICD-10-CM | POA: Insufficient documentation

## 2019-09-23 DIAGNOSIS — Y929 Unspecified place or not applicable: Secondary | ICD-10-CM | POA: Insufficient documentation

## 2019-09-23 DIAGNOSIS — W25XXXA Contact with sharp glass, initial encounter: Secondary | ICD-10-CM | POA: Insufficient documentation

## 2019-09-23 DIAGNOSIS — Y9389 Activity, other specified: Secondary | ICD-10-CM | POA: Insufficient documentation

## 2019-09-23 MED ORDER — TETANUS-DIPHTH-ACELL PERTUSSIS 5-2.5-18.5 LF-MCG/0.5 IM SUSP
0.5000 mL | Freq: Once | INTRAMUSCULAR | Status: AC
  Administered 2019-09-23: 0.5 mL via INTRAMUSCULAR
  Filled 2019-09-23: qty 0.5
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
## 2019-09-23 NOTE — ED Notes (Addendum)
Dressing applied to left forearm.  Blood around wound cleaned. Pt given phone to call friend for ride home.
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
## 2020-02-14 IMAGING — CT CT HEAD W/O CM
2 of 3 series · 13 of 47 positions shown, 16 images · non-contrast
Comparison: CT and pelvis dated June 20, 2017.

CLINICAL DATA: Acute pain due to trauma. Left-sided flank pain.

EXAM:
CT HEAD WITHOUT CONTRAST
CT CERVICAL SPINE WITHOUT CONTRAST
CT CHEST, ABDOMEN AND PELVIS WITHOUT CONTRAST
TECHNIQUE: Contiguous axial images were obtained from the base of the skull
through the vertex without intravenous contrast.
Multidetector CT imaging of the cervical spine was performed without
intravenous contrast. Multiplanar CT image reconstructions were also
generated.
Multidetector CT imaging of the chest, abdomen and pelvis was
performed following the standard protocol without IV contrast.

[Series 2: head wo · axial · 0.48mm/px · z∈[-204,-79]mm · 10 of 31 slices shown, 13 images]
[im 3/31  brain]
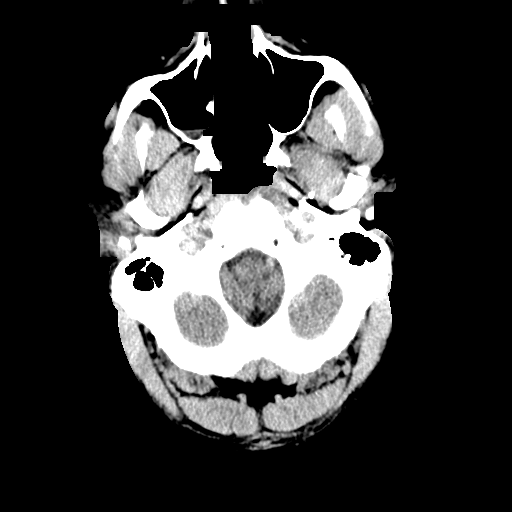
[im 3/31  bone]
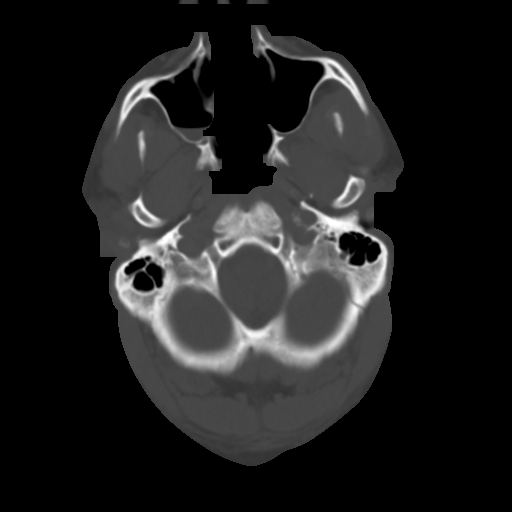
[im 6/31  brain]
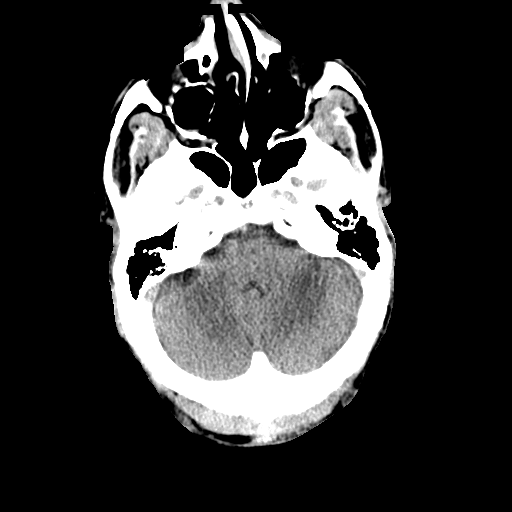
[im 9/31  brain]
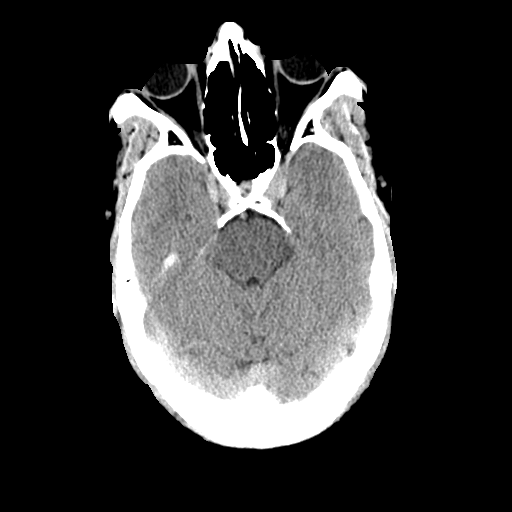
[im 11/31  brain]
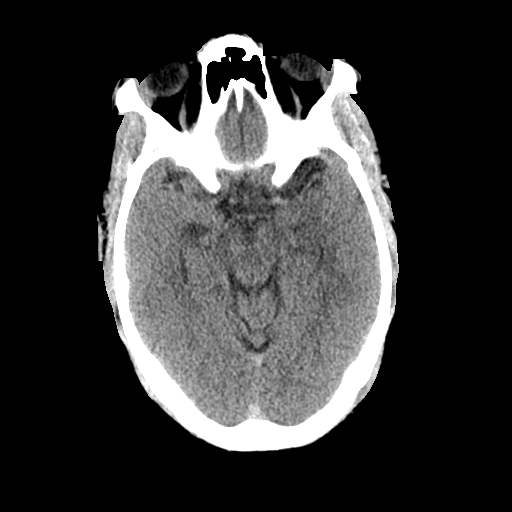
[im 14/31  brain]
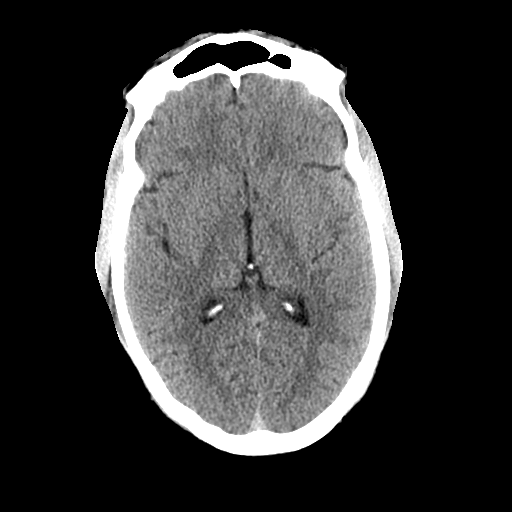
[im 14/31  bone]
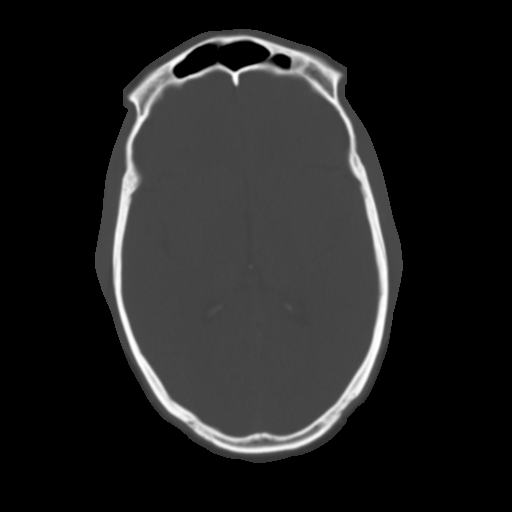
[im 17/31  brain]
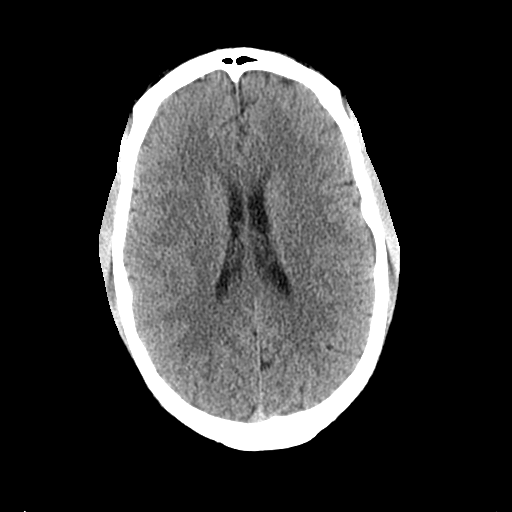
[im 20/31  brain]
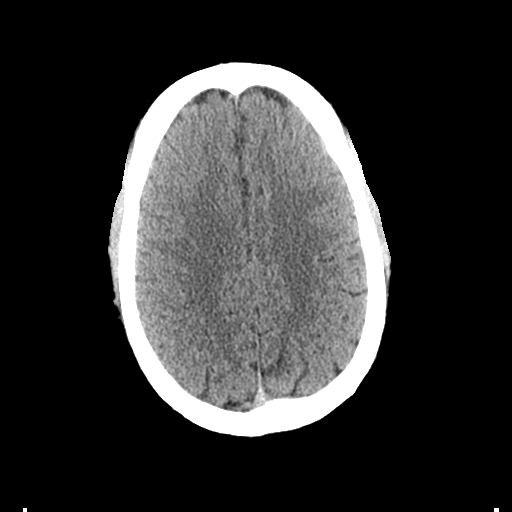
[im 23/31  brain]
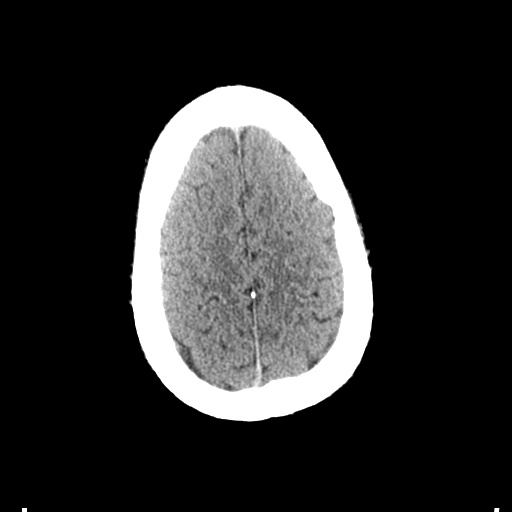
[im 25/31  brain]
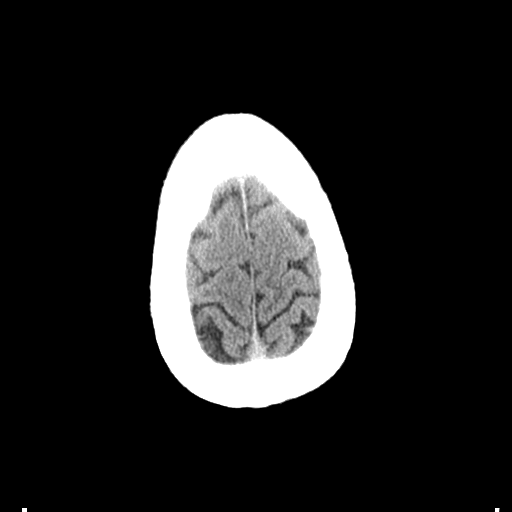
[im 25/31  bone]
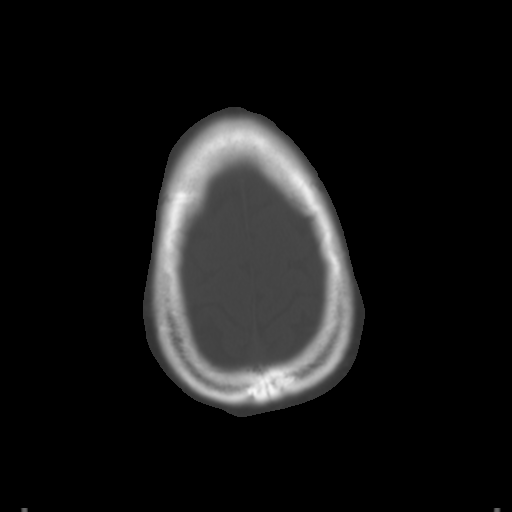
[im 28/31  brain]
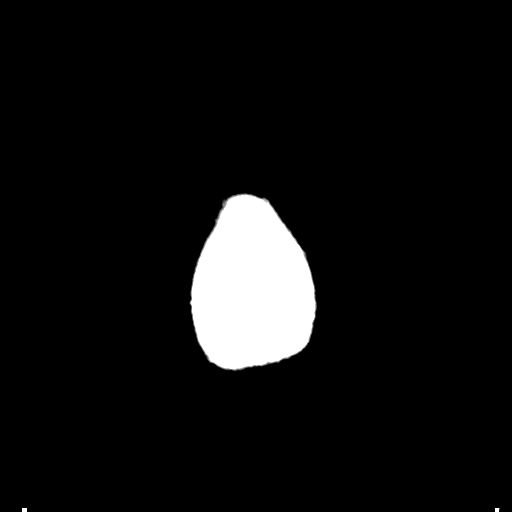

[Series 4: coronal soft tissue · coronal · 0.31mm/px · 3 of 73 slices shown]
[im 25/73  brain]
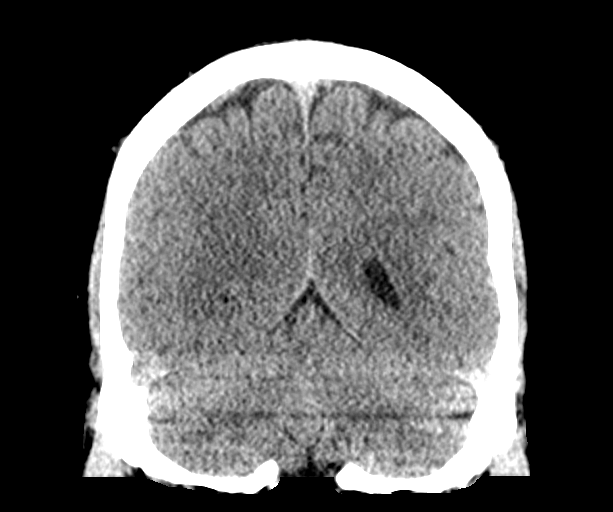
[im 33/73  brain]
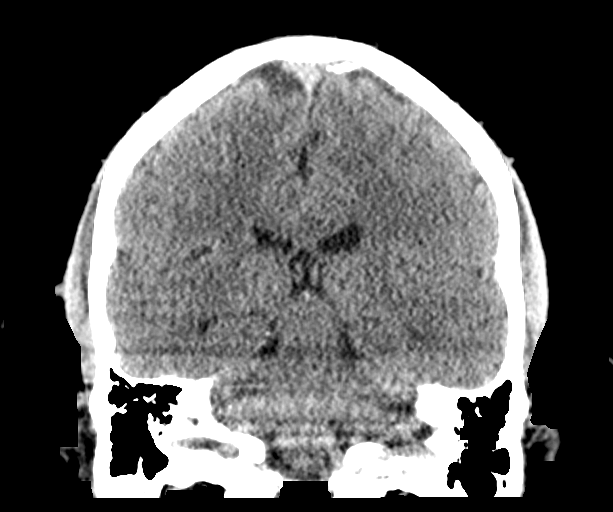
[im 41/73  brain]
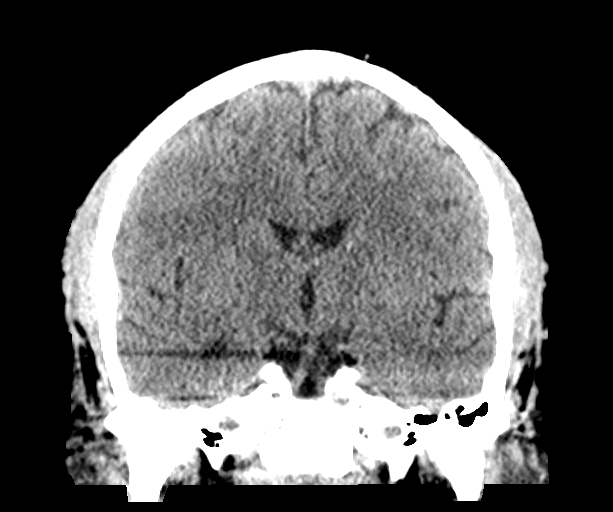

[13 of 47 positions shown; findings below may reference images not displayed]

FINDINGS: CT HEAD FINDINGS

Brain: No evidence of acute infarction, hemorrhage, hydrocephalus,
extra-axial collection or mass lesion/mass effect.

Vascular: No hyperdense vessel or unexpected calcification.

Skull: Normal. Negative for fracture or focal lesion.

Sinuses/Orbits: There is right maxillary sinus mucosal thickening
with a small air-fluid level. The remaining paranasal sinuses and
mastoid air cells are essentially clear. There is some fragmentation
of the right nasal bones which is felt to be chronic.

Other: None.

CT CERVICAL FINDINGS

Alignment: There is straightening of the normal cervical lordotic
curvature.

Skull base and vertebrae: No acute fracture. No primary bone lesion
or focal pathologic process.

Soft tissues and spinal canal: No prevertebral fluid or swelling. No
visible canal hematoma.

Disc levels:  There are no significant degenerative changes.

Other: None.

CT CHEST FINDINGS

Cardiovascular: The heart size is normal. There is no evidence for
thoracic aortic dissection or aneurysm. There is no large centrally
located pulmonary embolism. The heart size is normal. There is no
significant pericardial effusion.

Mediastinum/Nodes:

--No mediastinal or hilar lymphadenopathy.

--No axillary lymphadenopathy.

--No supraclavicular lymphadenopathy.

--Normal thyroid gland.

--The esophagus is unremarkable

Lungs/Pleura: Evaluation of the lung fields is limited by
respiratory motion artifact. There is some atelectasis at the lung
bases. There is a small left-sided hemothorax. No evidence for
left-sided pneumothorax. The trachea is unremarkable.

Musculoskeletal: There are acute minimally displaced fractures
involving the left fifth through seventh ribs laterally on the left.
There are minimally displaced fractures involving the ninth through
twelfth ribs posteriorly on the left. There are mild compression
fractures involving the T6 and T7 vertebral bodies. There is no
significant retropulsion.

CT ABDOMEN AND PELVIS FINDINGS

Hepatobiliary: There is decreased hepatic attenuation suggestive of
hepatic steatosis. Normal gallbladder.There is no biliary ductal
dilation.

Pancreas: Normal contours without ductal dilatation. No
peripancreatic fluid collection.

Spleen: No splenic laceration or hematoma.

Adrenals/Urinary Tract:

--Adrenal glands: No adrenal hemorrhage.

--Right kidney/ureter: No hydronephrosis or perinephric hematoma.

--Left kidney/ureter: No hydronephrosis or perinephric hematoma.

--Urinary bladder: Unremarkable.

Stomach/Bowel:

--Stomach/Duodenum: No hiatal hernia or other gastric abnormality.
Normal duodenal course and caliber.

--Small bowel: No dilatation or inflammation.

--Colon: No focal abnormality.

--Appendix: Normal.

Vascular/Lymphatic: Atherosclerotic calcification is present within
the non-aneurysmal abdominal aorta, without hemodynamically
significant stenosis.

--No retroperitoneal lymphadenopathy.

--No mesenteric lymphadenopathy.

--No pelvic or inguinal lymphadenopathy.

Reproductive: Unremarkable

Other: There is a subtle subcutaneous contusion overlying the soft
tissues adjacent to the proximal right femur. A small subcutaneous
contusion is noted along the patient's left flank (axial series 504,
image 79). The abdominal wall is normal.

Musculoskeletal. No acute displaced fractures.
IMPRESSION: 1. Acute minimally displaced fractures involving the left fifth
through seventh ribs laterally. There are minimally displaced
fractures involving the ninth through twelfth ribs posteriorly on
the left. The left eighth rib does not appear to be fractured. There
is a small left-sided hemothorax. No evidence for left-sided
pneumothorax.
2. Mild acute appearing compression fractures involving the T6 and
T7 vertebral bodies. No significant retropulsion.
3. Subtle subcutaneous contusion overlying the soft tissues adjacent
to the proximal right femur. A small subcutaneous contusion is noted
along the patient's left flank.
4. No acute intracranial abnormality.
5. No acute cervical spine fracture.
6. Hepatic steatosis.
7. Aortic Atherosclerosis ([id number]).

## 2021-01-08 ENCOUNTER — Other Ambulatory Visit: Payer: Self-pay

## 2021-01-08 ENCOUNTER — Emergency Department
Admission: EM | Admit: 2021-01-08 | Discharge: 2021-01-08 | Disposition: A | Discharge status: Home / Self Care | Payer: Self-pay | Attending: Emergency Medicine | Admitting: Emergency Medicine

## 2021-01-08 DIAGNOSIS — J029 Acute pharyngitis, unspecified: Secondary | ICD-10-CM | POA: Insufficient documentation

## 2021-01-08 DIAGNOSIS — F17210 Nicotine dependence, cigarettes, uncomplicated: Secondary | ICD-10-CM | POA: Insufficient documentation

## 2021-01-08 DIAGNOSIS — J069 Acute upper respiratory infection, unspecified: Secondary | ICD-10-CM | POA: Insufficient documentation

## 2021-01-08 DIAGNOSIS — Z20822 Contact with and (suspected) exposure to covid-19: Secondary | ICD-10-CM | POA: Insufficient documentation

## 2021-01-08 DIAGNOSIS — I10 Essential (primary) hypertension: Secondary | ICD-10-CM | POA: Insufficient documentation

## 2021-01-08 DIAGNOSIS — Z79899 Other long term (current) drug therapy: Secondary | ICD-10-CM | POA: Insufficient documentation

## 2021-01-08 HISTORY — DX: Essential (primary) hypertension: I10

## 2021-01-08 LAB — GROUP A STREP BY PCR: Group A Strep by PCR: NOT DETECTED

## 2021-01-08 LAB — SARS CORONAVIRUS 2 (TAT 6-24 HRS): SARS Coronavirus 2: NEGATIVE

## 2021-01-08 MED ORDER — KETOROLAC TROMETHAMINE 60 MG/2ML IM SOLN
60.0000 mg | Freq: Once | INTRAMUSCULAR | Status: AC
  Administered 2021-01-08: 60 mg via INTRAMUSCULAR
  Filled 2021-01-08: qty 2

## 2021-01-08 MED ORDER — IBUPROFEN 800 MG PO TABS: 800.0000 mg | ORAL_TABLET | Freq: Three times a day (TID) | ORAL | 0 refills | Status: DC | PRN

## 2021-01-08 MED ORDER — LIDOCAINE VISCOUS HCL 2 % MT SOLN: 5.0000 mL | Freq: Four times a day (QID) | OROMUCOSAL | 0 refills | Status: DC | PRN

## 2021-01-08 MED ORDER — PSEUDOEPH-BROMPHEN-DM 30-2-10 MG/5ML PO SYRP: 5.0000 mL | ORAL_SOLUTION | Freq: Four times a day (QID) | ORAL | 0 refills | Status: DC | PRN

## 2021-01-08 NOTE — ED Triage Notes (Signed)
Pt c/o sore throat with neck pain, body aches since yesterday. Pt is in NAD

## 2021-01-08 NOTE — ED Provider Notes (Signed)
[facility] Emergency Department Provider Note   ____________________________________________   Event Date/Time   First MD Initiated Contact with Patient 01/08/21 [phone]     (approximate)  I have reviewed the triage vital signs and the nursing notes.   HISTORY  Chief Complaint Sore Throat    HPI Danny Weeks is a 40 y.o. male patient complain of sore throat and body aches onset yesterday.  Patient denies recent travel or known contact with COVID-19.  Patient has taken the COVID-vaccine.  Has not taken flu shot.  Patient taking tolerate food and fluids without discomfort.  Patient rates his pain as a 5/10.  Described pain as "achy".  No palliative measure for complaint.         Past Medical History:  Diagnosis Date   Hypertension     Patient Active Problem List   Diagnosis Date Noted   HTN (hypertension) 11/09/2018   Nausea vomiting and diarrhea    Abdominal pain 06/20/2017   Intractable nausea and vomiting 06/20/2017   Tachycardia 06/20/2017   Accelerated hypertension 06/20/2017    Past Surgical History:  Procedure Laterality Date   FRACTURE SURGERY      Prior to Admission medications   Medication Sig Start Date End Date Taking? Authorizing Provider  brompheniramine-pseudoephedrine-DM 30-2-10 MG/5ML syrup Take 5 mLs by mouth 4 (four) times daily as needed. Mix with 5 mL of viscous lidocaine for swish and swallow. 01/08/21  Yes Joni Reining, PA-C  ibuprofen (ADVIL) 800 MG tablet Take 1 tablet (800 mg total) by mouth every 8 (eight) hours as needed for moderate pain. 01/08/21  Yes Joni Reining, PA-C  lidocaine (XYLOCAINE) 2 % solution Use as directed 5 mLs in the mouth or throat every 6 (six) hours as needed for mouth pain. Mix with 5 mL of Bromfed-DM for swish and swallow. 01/08/21  Yes Joni Reining, PA-C  amLODipine (NORVASC) 10 MG tablet Take 1 tablet (10 mg total) by mouth daily. 11/11/18   Salary, Evelena Asa, MD  hydrochlorothiazide  (HYDRODIURIL) 25 MG tablet Take 1 tablet (25 mg total) by mouth daily. 11/11/18   Salary, Evelena Asa, MD  multivitamin (ONE-A-DAY MEN'S) TABS tablet Take 1 tablet by mouth daily.    [provider]  omeprazole (PRILOSEC OTC) 20 MG tablet Take 20 mg by mouth daily.    [provider]    Allergies Patient has no known allergies.  No family history on file.  Social History Social History   Tobacco Use   Smoking status: Every Day    Packs/day: 1.00    Pack years: 0.00    Types: Cigarettes   Smokeless tobacco: Never  Vaping Use   Vaping Use: Never used  Substance Use Topics   Alcohol use: Yes   Drug use: No    Review of Systems  Constitutional: No fever/chills.  Body aches and fatigue.  Eyes: No visual changes. ENT:  sore throat and nasal congestion. Cardiovascular: Denies chest pain. Respiratory: Denies shortness of breath. Gastrointestinal: No abdominal pain.  No nausea, no vomiting.  No diarrhea.  No constipation. Genitourinary: Negative for dysuria. Musculoskeletal: Negative for back pain. Skin: Negative for rash. Neurological: Negative for headaches, focal weakness or numbness. Endocrine: Hypertension ____________________________________________   PHYSICAL EXAM:  VITAL SIGNS: ED Triage Vitals  Enc Vitals Group     BP 01/08/21 0845 (!) 159/121     Pulse Rate 01/08/21 0843 (!) 107     Resp 01/08/21 0843 17  Temp 01/08/21 0843 98.7 F (37.1 C)     Temp Source 01/08/21 0843 Oral     SpO2 01/08/21 0843 98 %     Weight 01/08/21 0843 240 lb (108.9 kg)     Height 01/08/21 0843 6\' 3"  (1.905 m)     Head Circumference --      Peak Flow --      Pain Score 01/08/21 0843 5     Pain Loc --      Pain Edu? --      Excl. in [place]? --     Constitutional: Alert and oriented. Well appearing and in no acute distress. Eyes: Conjunctivae are normal. PERRL. EOMI. Head: Atraumatic. Nose: Nasal turbinates clear rhinorrhea.   Mouth/Throat: Mucous membranes are  moist.  Oropharynx non-erythematous. Neck: No stridor.  No cervical spine tenderness to palpation. Hematological/Lymphatic/Immunilogical: No cervical lymphadenopathy. Cardiovascular: Normal rate, regular rhythm. Grossly normal heart sounds.  Good peripheral circulation. Respiratory: Normal respiratory effort.  No retractions. Lungs CTAB. Gastrointestinal: Soft and nontender. No distention. No abdominal bruits. No  Musculoskeletal: No lower extremity tenderness nor edema.  No joint effusions. Neurologic:  Normal speech and language. No gross focal neurologic deficits are appreciated. No gait instability. Skin:  Skin is warm, dry and intact. No rash noted. Psychiatric: Mood and affect are normal. Speech and behavior are normal.  ____________________________________________   LABS (all labs ordered are listed, but only abnormal results are displayed)  Labs Reviewed  GROUP A STREP BY PCR  SARS CORONAVIRUS 2 (TAT 6-24 HRS)   ____________________________________________  EKG   ____________________________________________  RADIOLOGY 01/10/21, personally viewed and evaluated these images (plain radiographs) as part of my medical decision making, as well as reviewing the written report by the radiologist.  ED MD interpretation:    Official radiology report(s): No results found.  ____________________________________________   PROCEDURES  Procedure(s) performed (including Critical Care):  Procedures   ____________________________________________   INITIAL IMPRESSION / ASSESSMENT AND PLAN / ED COURSE  As part of my medical decision making, I reviewed the following data within the electronic MEDICAL RECORD NUMBER         Patient presents with sore throat and neck pain.  Patient also stated body aches.  Onset of complaint was yesterday.  Discussed negative rapid strep test results with patient.  Advised patient COVID-19 test is pending can be found later in the MyChart  app.  If positive for COVID-19  quarantine additional 5 days.  Take medication as directed.      ____________________________________________   FINAL CLINICAL IMPRESSION(S) / ED DIAGNOSES  Final diagnoses:  Viral pharyngitis  Viral upper respiratory tract infection     ED Discharge Orders          Ordered    brompheniramine-pseudoephedrine-DM 30-2-10 MG/5ML syrup  4 times daily PRN        01/08/21 1036    lidocaine (XYLOCAINE) 2 % solution  Every 6 hours PRN        01/08/21 1036    ibuprofen (ADVIL) 800 MG tablet  Every 8 hours PRN        01/08/21 1036             Note:  This document was prepared using Dragon voice recognition software and may include unintentional dictation errors.    01/10/21, PA-C 01/08/21 1040    01/10/21, MD 01/09/21 2326

## 2021-01-08 NOTE — ED Notes (Signed)
First Nurse Note: Pt to ED via POV c/o sore throat and body aches. Pt states that he took a COVID test at home yesterday and it was negative. Pt is in NAD.

## 2021-01-08 NOTE — ED Notes (Signed)
See triage note  Presents with body aches,neck discomfort and sore throat  States sx's started last pm   Afebrile on arrival

## 2021-01-08 NOTE — Discharge Instructions (Addendum)
COVID-19 test results are pending.  Results will be found later in the MyChart app.  Advised self quarantine pending results of the test.  Follow discharge care instruction take medication as directed.

## 2021-01-14 ENCOUNTER — Other Ambulatory Visit: Payer: Self-pay

## 2021-01-14 ENCOUNTER — Emergency Department: Payer: Self-pay

## 2021-01-14 ENCOUNTER — Emergency Department
Admission: EM | Admit: 2021-01-14 | Discharge: 2021-01-14 | Disposition: A | Discharge status: Home / Self Care | Payer: Self-pay | Attending: Emergency Medicine | Admitting: Emergency Medicine

## 2021-01-14 DIAGNOSIS — I10 Essential (primary) hypertension: Secondary | ICD-10-CM | POA: Insufficient documentation

## 2021-01-14 DIAGNOSIS — J181 Lobar pneumonia, unspecified organism: Secondary | ICD-10-CM | POA: Insufficient documentation

## 2021-01-14 DIAGNOSIS — J189 Pneumonia, unspecified organism: Secondary | ICD-10-CM

## 2021-01-14 DIAGNOSIS — F17210 Nicotine dependence, cigarettes, uncomplicated: Secondary | ICD-10-CM | POA: Insufficient documentation

## 2021-01-14 DIAGNOSIS — Z79899 Other long term (current) drug therapy: Secondary | ICD-10-CM | POA: Insufficient documentation

## 2021-01-14 LAB — BASIC METABOLIC PANEL
Anion gap: 8 (ref 5–15)
BUN: 7 mg/dL (ref 6–20)
CO2: 28 mmol/L (ref 22–32)
Calcium: 9.4 mg/dL (ref 8.9–10.3)
Chloride: 103 mmol/L (ref 98–111)
Creatinine, Ser: 0.86 mg/dL (ref 0.61–1.24)
GFR, Estimated: >60 mL/min (ref >60)
Glucose, Bld: 88 mg/dL (ref 70–99)
Potassium: 3.7 mmol/L (ref 3.5–5.1)
Sodium: 139 mmol/L (ref 135–145)

## 2021-01-14 LAB — BRAIN NATRIURETIC PEPTIDE: B Natriuretic Peptide: 32 pg/mL (ref 0.0–100.0)

## 2021-01-14 LAB — TROPONIN I (HIGH SENSITIVITY)
Troponin I (High Sensitivity): 30 ng/L — ABNORMAL HIGH (ref <18)
Troponin I (High Sensitivity): 26 ng/L — ABNORMAL HIGH (ref <18)

## 2021-01-14 LAB — CBC
HCT: 45.2 % (ref 39.0–52.0)
Hemoglobin: 15.1 g/dL (ref 13.0–17.0)
MCH: 30.9 pg (ref 26.0–34.0)
MCHC: 33.4 g/dL (ref 30.0–36.0)
MCV: 92.6 fL (ref 80.0–100.0)
Platelets: 300 10*3/uL (ref 150–400)
RBC: 4.88 MIL/uL (ref 4.22–5.81)
RDW: 13.1 % (ref 11.5–15.5)
WBC: 6.8 10*3/uL (ref 4.0–10.5)
nRBC: 0 % (ref 0.0–0.2)

## 2021-01-14 LAB — LACTIC ACID, PLASMA: Lactic Acid, Venous: 1.1 mmol/L (ref 0.5–1.9)

## 2021-01-14 MED ORDER — AMLODIPINE BESYLATE 10 MG PO TABS: 10.0000 mg | ORAL_TABLET | Freq: Every day | ORAL | 0 refills | Status: DC

## 2021-01-14 MED ORDER — AZITHROMYCIN 500 MG PO TABS
500.0000 mg | ORAL_TABLET | Freq: Once | ORAL | Status: AC
  Administered 2021-01-14: 500 mg via ORAL
  Filled 2021-01-14: qty 1

## 2021-01-14 MED ORDER — AZITHROMYCIN 250 MG PO TABS: ORAL_TABLET | ORAL | 0 refills | Status: DC

## 2021-01-14 MED ORDER — AMLODIPINE BESYLATE 5 MG PO TABS
10.0000 mg | ORAL_TABLET | Freq: Once | ORAL | Status: AC
  Administered 2021-01-14: 10 mg via ORAL
  Filled 2021-01-14: qty 2

## 2021-01-14 NOTE — ED Notes (Signed)
2nd lactic not needed due to 1st being negative.

## 2021-01-14 NOTE — ED Notes (Signed)
Given snack. 

## 2021-01-14 NOTE — ED Provider Notes (Signed)
[facility] Emergency Department Provider Note   ____________________________________________   Event Date/Time   First MD Initiated Contact with Patient 01/14/21 1513     (approximate)  I have reviewed the triage vital signs and the nursing notes.   HISTORY  Chief Complaint Shortness of Breath and Chest Pain    HPI Danny Weeks is a 40 y.o. male history of hypertension recent viral illness/pharyngitis  Patient reports over the last day or so he has noticed a slight achiness when he take a deep breath in his left lower lung slight cough, and mild shortness of breath.  Feels like he strained developed "pneumonia"  He denies any chest pain except for slight achiness with taking deep inspiration.  No heavy chest pain or pressure.  No history of cardiac disease but does have high blood pressure but is not currently on medication and does not have a primary doctor   No abdominal pain no fevers or chills.  He does report a cough slightly productive at times.  Mild shortness of breath        Past Medical History:  Diagnosis Date   Hypertension     Patient Active Problem List   Diagnosis Date Noted   HTN (hypertension) 11/09/2018   Nausea vomiting and diarrhea    Abdominal pain 06/20/2017   Intractable nausea and vomiting 06/20/2017   Tachycardia 06/20/2017   Accelerated hypertension 06/20/2017    Past Surgical History:  Procedure Laterality Date   FRACTURE SURGERY      Prior to Admission medications   Medication Sig Start Date End Date Taking? Authorizing Provider  amLODipine (NORVASC) 10 MG tablet Take 1 tablet (10 mg total) by mouth daily. 01/14/21 01/14/22 Yes Sharyn Creamer, MD  azithromycin (ZITHROMAX) 250 MG tablet 1 tab by mouth daily 01/14/21  Yes Sharyn Creamer, MD  brompheniramine-pseudoephedrine-DM 30-2-10 MG/5ML syrup Take 5 mLs by mouth 4 (four) times daily as needed. Mix with 5 mL of viscous lidocaine for swish and swallow. 01/08/21    Joni Reining, PA-C  hydrochlorothiazide (HYDRODIURIL) 25 MG tablet Take 1 tablet (25 mg total) by mouth daily. 11/11/18   Salary, Evelena Asa, MD  ibuprofen (ADVIL) 800 MG tablet Take 1 tablet (800 mg total) by mouth every 8 (eight) hours as needed for moderate pain. 01/08/21   Joni Reining, PA-C  lidocaine (XYLOCAINE) 2 % solution Use as directed 5 mLs in the mouth or throat every 6 (six) hours as needed for mouth pain. Mix with 5 mL of Bromfed-DM for swish and swallow. 01/08/21   Joni Reining, PA-C  multivitamin (ONE-A-DAY MEN'S) TABS tablet Take 1 tablet by mouth daily.    [provider]  omeprazole (PRILOSEC OTC) 20 MG tablet Take 20 mg by mouth daily.    [provider]    Allergies Patient has no known allergies.  No family history on file.  Social History Social History   Tobacco Use   Smoking status: Every Day    Packs/day: 1.00    Pack years: 0.00    Types: Cigarettes   Smokeless tobacco: Never  Vaping Use   Vaping Use: Never used  Substance Use Topics   Alcohol use: Yes   Drug use: No    Review of Systems Constitutional: No fever/chills but had fevers sore throat about a week ago Eyes: No visual changes. ENT: Sore throat a week ago resolved Cardiovascular: See HPI respiratory: Very mild feeling of shortness of breath gastrointestinal: No abdominal  pain.   Genitourinary: Negative for dysuria. Musculoskeletal: Negative for back pain. Skin: Negative for rash. Neurological: Negative for headaches, areas of focal weakness or numbness.    ____________________________________________   PHYSICAL EXAM:  VITAL SIGNS: ED Triage Vitals [01/14/21 1304]  Enc Vitals Group     BP (!) 178/129     Pulse Rate (!) 108     Resp 20     Temp 98.3 F (36.8 C)     Temp Source Oral     SpO2 98 %     Weight 238 lb 1.6 oz (108 kg)     Height 6\' 3"  (1.905 m)     Head Circumference      Peak Flow      Pain Score 0     Pain Loc      Pain Edu?       Excl. in GC?     Constitutional: Alert and oriented. Well appearing and in no acute distress.  Very pleasant. Eyes: Conjunctivae are normal. Head: Atraumatic. Nose: No congestion/rhinnorhea. Mouth/Throat: Mucous membranes are moist. Neck: No stridor.  Cardiovascular: Normal rate, regular rhythm. Grossly normal heart sounds.  Good peripheral circulation. Respiratory: Normal respiratory effort.  No retractions. Lungs CTAB, minimal appreciated crackles left lower. Gastrointestinal: Soft and nontender. No distention. Musculoskeletal: No lower extremity tenderness nor edema. Neurologic:  Normal speech and language. No gross focal neurologic deficits are appreciated.  Skin:  Skin is warm, dry and intact. No rash noted. Psychiatric: Mood and affect are normal. Speech and behavior are normal.  ____________________________________________   LABS (all labs ordered are listed, but only abnormal results are displayed)  Labs Reviewed  TROPONIN I (HIGH SENSITIVITY) - Abnormal; Notable for the following components:      Result Value   Troponin I (High Sensitivity) 30 (*)    All other components within normal limits  TROPONIN I (HIGH SENSITIVITY) - Abnormal; Notable for the following components:   Troponin I (High Sensitivity) 26 (*)    All other components within normal limits  BASIC METABOLIC PANEL  CBC  LACTIC ACID, PLASMA  BRAIN NATRIURETIC PEPTIDE   ____________________________________________  EKG  ED ECG REPORT I, , the attending physician, personally viewed and interpreted this ECG.  Date: 01/14/2021 EKG Time: 1308 Rate: 99 Rhythm: normal sinus rhythm QRS Axis: normal Intervals: normal ST/T Wave abnormalities: normal Narrative Interpretation: no evidence of acute ischemia  ____________________________________________  RADIOLOGY  DG Chest 2 View  Result Date: 01/14/2021 CLINICAL DATA:  Shortness of breath. EXAM: CHEST - 2 VIEW COMPARISON:  Chest CT August 31, 2019. FINDINGS: The heart size and mediastinal contours are within normal limits. Hazy left lower lobe opacity. No pleural effusion. No pneumothorax. Remote left-sided rib fractures with adjacent pleural thickening/parenchymal scarring. IMPRESSION: Hazy left lower lobe opacity suspicious for pneumonia. Electronically Signed   By: 09[birth year]08[birth year]2004 MD   On: 01/14/2021 14:09    Chest x-ray reviewed by me, left lower lobe haziness suspicious for pneumonia  ____________________________________________   PROCEDURES  Procedure(s) performed: None  Procedures  Critical Care performed: No  ____________________________________________   INITIAL IMPRESSION / ASSESSMENT AND PLAN / ED COURSE  Pertinent labs & imaging results that were available during my care of the patient were reviewed by me and considered in my medical decision making (see chart for details).  Patient presents with mild shortness of breath and mild left sided pleurisy.  He had a recent upper respiratory infection tested negative for COVID at that time.  He reports that many of his infectious symptoms have improved but he is having a slight productive cough mild shortness of breath.  Denies chest pain except for minimal pleuritic discomfort.  Chest x-ray reviewed and in context of clinical setting with mild crackles left lower lung appears consistent with pneumonia.  Commune acquired pneumonia, likely post viral  Demonstrates no evidence of acute cardiac ischemia he does have significant hypertension has a history of same and has not been on blood pressure medicine for.  His first troponin is slightly elevated without symptoms of ACS, and minimal elevation and repeat troponin with downtrend feel confident the patient is experiencing ACS.  I did discuss with him his test results, and considered observation in the hospital but patient reports that he thinks he just has a slight pneumonia and does not want to stay in the hospital which is  also quite reasonable  Rather will prescribe Norvasc, careful return precautions discussed, referral to cardiology for accelerated hypertension and return precautions around pneumonia.  Z-Pak given patient to start this tomorrow  Clinical Course as of 01/14/21 1755  Tue Jan 14, 2021  1529 PERC negative [MQ]    Clinical Course User Index [MQ] Sharyn Creamer, MD   No recent hospitalizations no travel.  No recent surgeries.  Previously hospitalized with major traumatic injuries at [facility] a year ago.  No leg swelling.  No signs symptoms of DVT PE.    Return precautions and treatment recommendations and follow-up discussed with the patient who is agreeable with the plan.  ____________________________________________   FINAL CLINICAL IMPRESSION(S) / ED DIAGNOSES  Final diagnoses:  Community acquired pneumonia of left lower lobe of lung  Hypertension, unspecified type        Note:  This document was prepared using Dragon voice recognition software and may include unintentional dictation errors       Sharyn Creamer, MD 01/14/21 1809

## 2021-01-14 NOTE — ED Triage Notes (Signed)
Pt to ED for shob that started this am and cp with inspiration.  Hx HTN, does not take meds Pt in NAD, talking in complete sentences, RR even and unlabored. Denies N/v

## 2021-02-14 ENCOUNTER — Ambulatory Visit: Payer: Self-pay | Admitting: Cardiology

## 2021-07-01 IMAGING — CR DG CHEST 2V
1 series · 2 of 2 positions shown · non-contrast
Comparison: Chest CT August 31, 2019.

CLINICAL DATA: Shortness of breath.

EXAM:
CHEST - 2 VIEW

[Series 1: dg chest 2 view · 0.14mm/px · 2 of 2 slices shown]
[im 1/2]
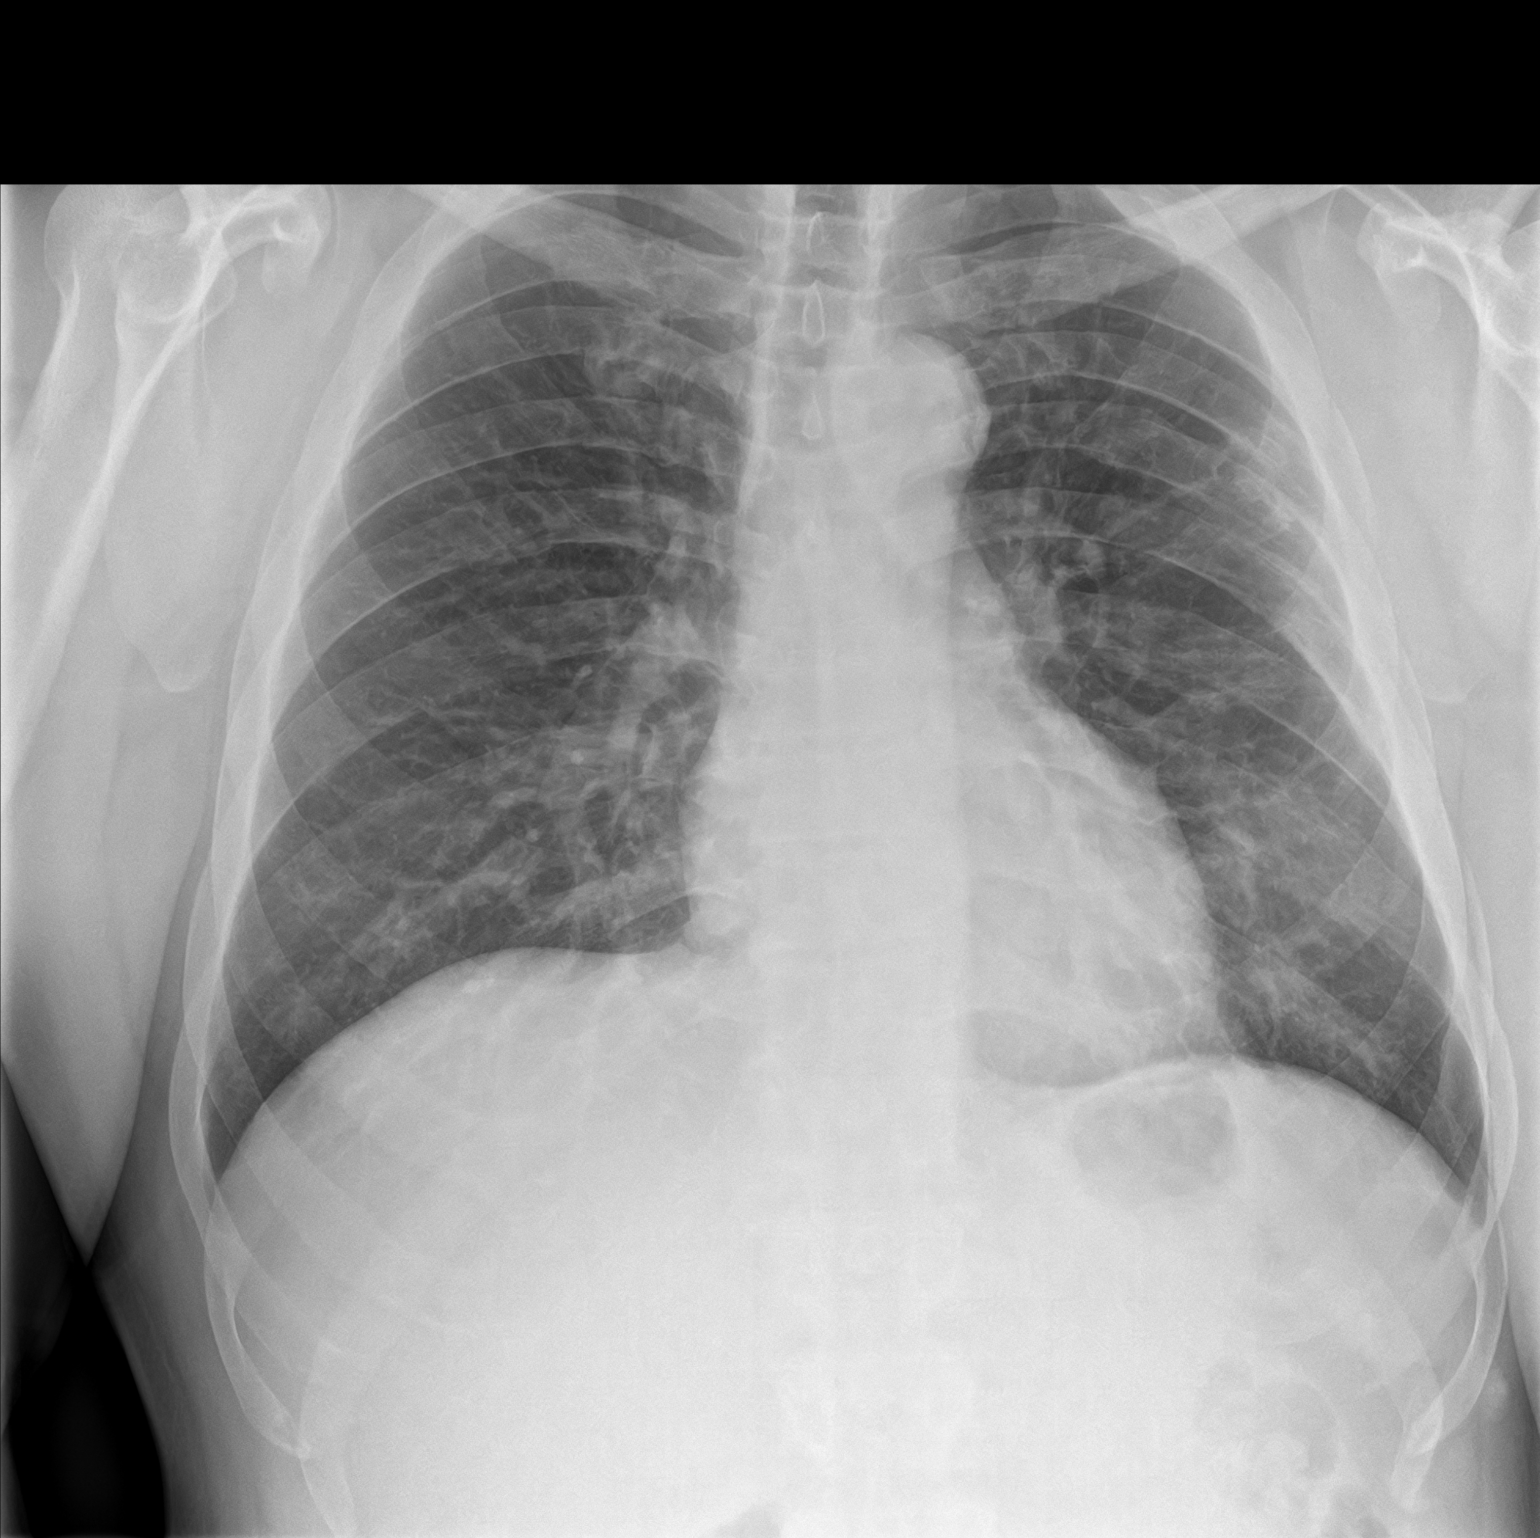
[im 2/2]
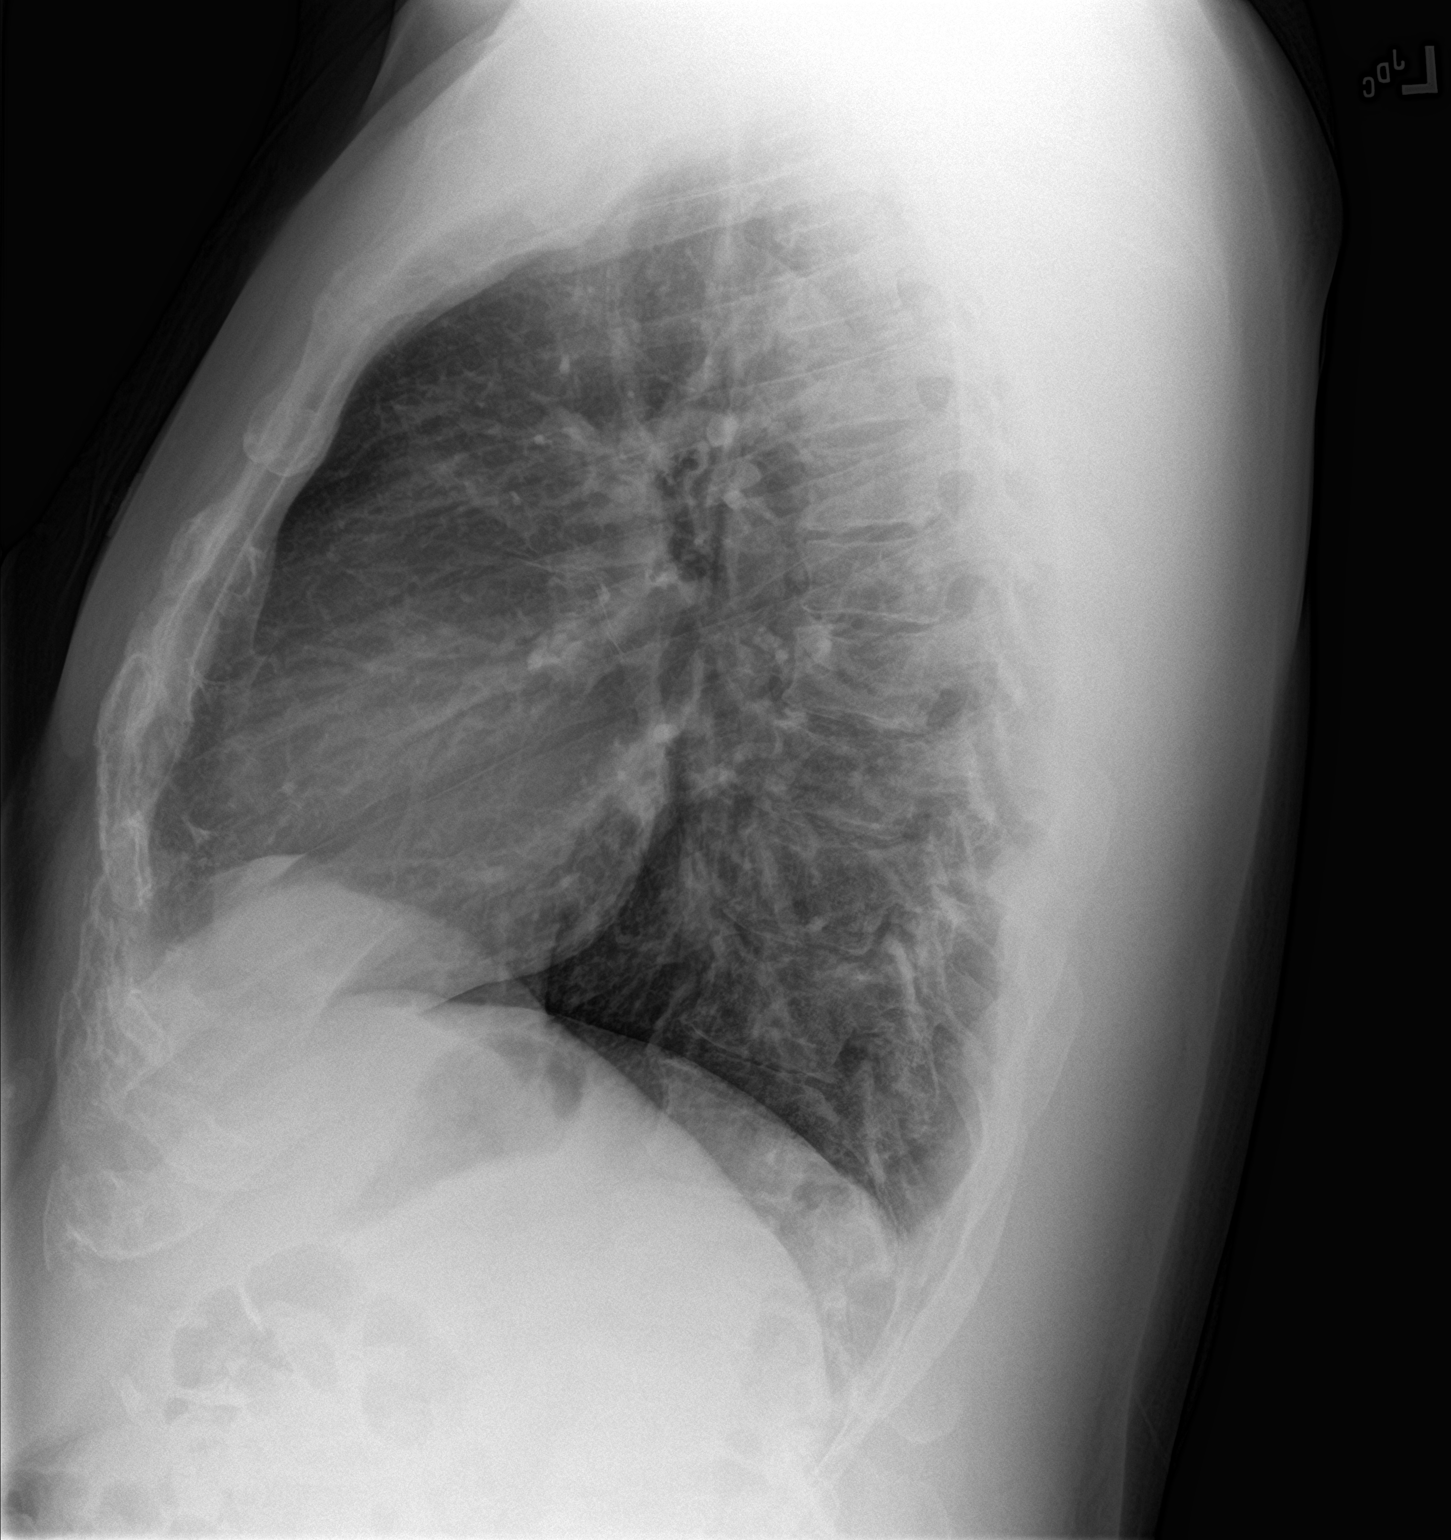

[2 of 2 positions shown; findings below may reference images not displayed]

FINDINGS: The heart size and mediastinal contours are within normal limits.
Hazy left lower lobe opacity. No pleural effusion. No pneumothorax.
Remote left-sided rib fractures with adjacent pleural
thickening/parenchymal scarring.
IMPRESSION: Hazy left lower lobe opacity suspicious for pneumonia.

## 2022-02-25 ENCOUNTER — Ambulatory Visit: Payer: Self-pay | Admitting: Nurse Practitioner

## 2022-02-25 ENCOUNTER — Encounter: Payer: Self-pay | Admitting: Nurse Practitioner

## 2022-02-25 DIAGNOSIS — Z113 Encounter for screening for infections with a predominantly sexual mode of transmission: Secondary | ICD-10-CM

## 2022-02-25 LAB — HEPATITIS B SURFACE ANTIGEN: Hepatitis B Surface Ag: NONREACTIVE

## 2022-02-25 LAB — HM HEPATITIS C SCREENING LAB: HM Hepatitis Screen: NEGATIVE

## 2022-02-25 LAB — HM HIV SCREENING LAB: HM HIV Screening: NEGATIVE

## 2022-02-25 NOTE — Progress Notes (Signed)
[facility] Department STI clinic/screening visit  Subjective:  Danny Weeks is a 41 y.o. male being seen today for an STI screening visit. The patient reports they do not have symptoms.    Patient has the following medical conditions:   Patient Active Problem List   Diagnosis Date Noted   HTN (hypertension) 11/09/2018   Nausea vomiting and diarrhea    Abdominal pain 06/20/2017   Intractable nausea and vomiting 06/20/2017   Tachycardia 06/20/2017   Accelerated hypertension 06/20/2017     Chief Complaint  Patient presents with   SEXUALLY TRANSMITTED DISEASE    Screening     HPI  Patient reports to clinic today for STD screening.  Patient is currently asymptomatic.    Does the patient or their partner desires a pregnancy in the next year? No  Screening for MPX risk: Does the patient have an unexplained rash? No Is the patient MSM? No Does the patient endorse multiple sex partners or anonymous sex partners? No Did the patient have close or sexual contact with a person diagnosed with MPX? No Has the patient traveled outside the [place] where MPX is endemic? No Is there a high clinical suspicion for MPX-- evidenced by one of the following No  -Unlikely to be chickenpox  -Lymphadenopathy  -Rash that present in same phase of evolution on any given body part   See flowsheet for further details and programmatic requirements.   Immunization History  Administered Date(s) Administered   Tdap 09/23/2019     The following portions of the patient's history were reviewed and updated as appropriate: allergies, current medications, past medical history, past social history, past surgical history and problem list.  Objective:  There were no vitals filed for this visit.  Physical Exam Constitutional:      Appearance: Normal appearance.  HENT:     Head: Normocephalic. No abrasion, masses or laceration. Hair is normal.     Right Ear: External ear normal.     Left Ear:  External ear normal.     Nose: Nose normal.     Mouth/Throat:     Mouth: No oral lesions.     Dentition: No dental caries.     Pharynx: No pharyngeal swelling, oropharyngeal exudate, posterior oropharyngeal erythema or uvula swelling.     Tonsils: No tonsillar exudate or tonsillar abscesses.  Eyes:     General: Lids are normal.        Right eye: No discharge.        Left eye: No discharge.     Conjunctiva/sclera: Conjunctivae normal.     Right eye: No exudate.    Left eye: No exudate. Abdominal:     General: Abdomen is flat.     Palpations: Abdomen is soft.     Tenderness: There is no abdominal tenderness. There is no rebound.  Genitourinary:    Pubic Area: No rash or pubic lice.      Penis: Normal and circumcised. No erythema or discharge.      Testes: Normal.        Right: Mass or tenderness not present.        Left: Mass or tenderness not present.     Rectum: Normal.     Comments: Discharge amount: None Color: None Musculoskeletal:     Cervical back: Full passive range of motion without pain, normal range of motion and neck supple.  Lymphadenopathy:     Cervical: No cervical adenopathy.     Right cervical: No superficial,  deep or posterior cervical adenopathy.    Left cervical: No superficial, deep or posterior cervical adenopathy.     Upper Body:     Right upper body: No supraclavicular, axillary or epitrochlear adenopathy.     Left upper body: No supraclavicular, axillary or epitrochlear adenopathy.     Lower Body: No right inguinal adenopathy. No left inguinal adenopathy.  Skin:    General: Skin is warm and dry.     Findings: No lesion or rash.  Neurological:     Mental Status: He is alert and oriented to person, place, and time.  Psychiatric:        Attention and Perception: Attention normal.        Mood and Affect: Mood normal.        Speech: Speech normal.        Behavior: Behavior normal. Behavior is cooperative.       Assessment and Plan:  Danny Weeks is a 41 y.o. male presenting to the [facility] Department for STI screening  1. Screening examination for venereal disease -41 year old male in clinic today for STD screening. -Patient does not have STI symptoms Patient accepted all screenings including  oral GC, urine CT/GC and bloodwork for HIV/RPR.  Patient meets criteria for HepB screening? Yes. Ordered? Yes Patient meets criteria for HepC screening? Yes. Ordered? Yes Recommended condom use with all sex Discussed importance of condom use for STI prevent  Discussed time line for State Lab results and that patient will be called with positive results and encouraged patient to call if he had not heard in 2 weeks  - HIV/HCV [facility] - Syphilis Serology, [facility] - HBV Antigen/Antibody State Lab - Chlamydia/GC NAA, Confirmation - Gonococcus culture  Total time spent: 20 minutes    Return if symptoms worsen or fail to improve.    Glenna Fellows, FNP

## 2022-02-28 LAB — CHLAMYDIA/GC NAA, CONFIRMATION
Chlamydia trachomatis, NAA: NEGATIVE
Neisseria gonorrhoeae, NAA: NEGATIVE

## 2022-03-02 LAB — GONOCOCCUS CULTURE

## 2022-03-11 ENCOUNTER — Telehealth: Payer: Self-pay

## 2022-03-11 NOTE — Telephone Encounter (Signed)
Password verified Informed of Syphilis Results and need for treatment  [facility] Collected 02/25/2022  RPR Qualitative: Reactive RPR Quantitative: 1:64 Syphilis TP: Reactive  Appts scheduled 03/12/2022, 03/19/2022, 03/26/2022  Danny Weeks states he hasn't had any symptoms, no known contact to any other syphilis case that he is aware of.  States he's not currently sexually active.

## 2022-03-12 ENCOUNTER — Encounter: Payer: Self-pay | Admitting: Nurse Practitioner

## 2022-03-12 ENCOUNTER — Ambulatory Visit: Payer: Self-pay

## 2022-03-12 DIAGNOSIS — Z8619 Personal history of other infectious and parasitic diseases: Secondary | ICD-10-CM | POA: Insufficient documentation

## 2022-03-12 DIAGNOSIS — A539 Syphilis, unspecified: Secondary | ICD-10-CM

## 2022-03-12 MED ORDER — PENICILLIN G BENZATHINE 1200000 UNIT/2ML IM SUSY
2.4000 10*6.[IU] | PREFILLED_SYRINGE | Freq: Once | INTRAMUSCULAR | Status: AC
  Administered 2022-03-19: 2.4 10*6.[IU] via INTRAMUSCULAR

## 2022-03-12 NOTE — Progress Notes (Signed)
In Nurse Clinic for syphilis tx- Bicillin #1. NKA.  Bicillin 2.4 mu given IM per order by Onalee Hua, FNP dated 03/10/2022. Order located on scanned copy of RPR results in Epic.   Tolerated injections well. Counseled to stay for 20 min observation. Pt declines to stay and says he needs to leave. Counseled pt to seek immediate medical attn if needed for allergic rxn.  Has appts to return for Bicillin #2 (03/19/2022) and #3 (03/27/23). Jerel Shepherd, RN

## 2022-03-19 ENCOUNTER — Ambulatory Visit: Payer: Self-pay

## 2022-03-19 DIAGNOSIS — A539 Syphilis, unspecified: Secondary | ICD-10-CM

## 2022-03-19 NOTE — Progress Notes (Signed)
In nurse clinic for Bicillin #2. Pt explains he had no problems from injections last week. Bicillin 2.4 mu given IM today per order by Onalee Hua, FNP dated 03/10/2022. Tolerated well today. Declined to stay for 20 min observation today and RN advised  pt to seek immediate medical attn if needed. Has appt for 03/26/22 for 3rd and final dose of Bicillin. Jerel Shepherd, RN

## 2022-03-26 ENCOUNTER — Ambulatory Visit: Payer: Self-pay

## 2022-03-26 DIAGNOSIS — A539 Syphilis, unspecified: Secondary | ICD-10-CM

## 2022-03-26 MED ORDER — PENICILLIN G BENZATHINE 1200000 UNIT/2ML IM SUSY
2.4000 10*6.[IU] | PREFILLED_SYRINGE | Freq: Once | INTRAMUSCULAR | Status: AC
  Administered 2022-03-26: 2.4 10*6.[IU] via INTRAMUSCULAR

## 2022-03-26 NOTE — Progress Notes (Signed)
In nurse clinic for Bicillin #3 today. Pt reports no problems with injections from past 2 weeks except "a little soreness." Bicillin 2.4 mu administered today per order by A White,FNP. Order for Bicillin  documented on scanned copy of RPR state lab report. Tolerated injections well today. Advised to stay for 20 min observation. Pt declined to stay and says he needs to go. Pt counseled to return in 6 mo for repeat RPR. Has reminder. Questions answered and reports understanding. Jerel Shepherd, RN

## 2023-12-06 ENCOUNTER — Emergency Department: Payer: Self-pay

## 2023-12-06 ENCOUNTER — Emergency Department
Admission: EM | Admit: 2023-12-06 | Discharge: 2023-12-06 | Disposition: A | Discharge status: Home / Self Care | Payer: Self-pay | Attending: Emergency Medicine | Admitting: Emergency Medicine

## 2023-12-06 ENCOUNTER — Other Ambulatory Visit: Payer: Self-pay

## 2023-12-06 DIAGNOSIS — S20211A Contusion of right front wall of thorax, initial encounter: Secondary | ICD-10-CM | POA: Insufficient documentation

## 2023-12-06 DIAGNOSIS — I10 Essential (primary) hypertension: Secondary | ICD-10-CM | POA: Insufficient documentation

## 2023-12-06 DIAGNOSIS — W182XXA Fall in (into) shower or empty bathtub, initial encounter: Secondary | ICD-10-CM | POA: Insufficient documentation

## 2023-12-06 LAB — CBC
HCT: 48.1 % (ref 39.0–52.0)
Hemoglobin: 16 g/dL (ref 13.0–17.0)
MCH: 30.8 pg (ref 26.0–34.0)
MCHC: 33.3 g/dL (ref 30.0–36.0)
MCV: 92.7 fL (ref 80.0–100.0)
Platelets: 238 10*3/uL (ref 150–400)
RBC: 5.19 MIL/uL (ref 4.22–5.81)
RDW: 13.4 % (ref 11.5–15.5)
WBC: 5.9 10*3/uL (ref 4.0–10.5)
nRBC: 0 % (ref 0.0–0.2)

## 2023-12-06 LAB — TROPONIN I (HIGH SENSITIVITY): Troponin I (High Sensitivity): 43 ng/L — ABNORMAL HIGH

## 2023-12-06 LAB — BASIC METABOLIC PANEL WITH GFR
Anion gap: 9 (ref 5–15)
BUN: 12 mg/dL (ref 6–20)
CO2: 28 mmol/L (ref 22–32)
Calcium: 9.4 mg/dL (ref 8.9–10.3)
Chloride: 101 mmol/L (ref 98–111)
Creatinine, Ser: 1.12 mg/dL (ref 0.61–1.24)
GFR, Estimated: >60 mL/min (ref >60)
Glucose, Bld: 150 mg/dL — ABNORMAL HIGH (ref 70–99)
Potassium: 3.9 mmol/L (ref 3.5–5.1)
Sodium: 138 mmol/L (ref 135–145)

## 2023-12-06 MED ORDER — AMLODIPINE BESYLATE 5 MG PO TABS
5.0000 mg | ORAL_TABLET | Freq: Once | ORAL | Status: AC
  Administered 2023-12-06: 5 mg via ORAL
  Filled 2023-12-06: qty 1

## 2023-12-06 MED ORDER — AMLODIPINE BESYLATE 5 MG PO TABS
5.0000 mg | ORAL_TABLET | Freq: Every day | ORAL | 0 refills | Status: DC
Start: 2023-12-06 — End: 2023-12-06

## 2023-12-06 MED ORDER — AMLODIPINE BESYLATE 5 MG PO TABS
5.0000 mg | ORAL_TABLET | Freq: Every day | ORAL | 3 refills | Status: AC
Start: 2023-12-06 — End: 2024-12-05

## 2023-12-06 MED ORDER — TRAMADOL HCL 50 MG PO TABS: 50.0000 mg | ORAL_TABLET | Freq: Four times a day (QID) | ORAL | 0 refills | Status: AC | PRN

## 2023-12-06 NOTE — ED Notes (Signed)
 See triage notes. Patient c/o right side pain secondary to a fall on Friday. Patient stated the pain goes from "his thigh to his eye"

## 2023-12-06 NOTE — ED Triage Notes (Addendum)
 Pt states he tripped int  he shower and states he has pain from "his thigh to his eye on R side. Pt denies LOC and states he just tripped in shower on Friday.   Pt HTN in triage, pt states HX of same but hasn't taken his BP meds in 5 years. Pt also tachycardic in triage.

## 2023-12-06 NOTE — ED Provider Notes (Signed)
 [facility] Provider Note    Event Date/Time   First MD Initiated Contact with Patient 12/06/23 1242     (approximate)   History   Right-sided chest wall pain  HPI  Danny Weeks is a 43 y.o. male who presents after a fall.  Patient reports he fell in the bathtub, he slipped, struck his right chest as well as his right cheek.  Has had pain in his right chest since then.  Presents today because of concern for possible rib fracture.  No shortness of breath reported.  Review of records demonstrates chronic hypertension and tachycardia     Physical Exam   Triage Vital Signs: ED Triage Vitals  Encounter Vitals Group     BP 12/06/23 1213 (!) 186/127     Systolic BP Percentile --      Diastolic BP Percentile --      Pulse Rate 12/06/23 1213 (!) 124     Resp 12/06/23 1213 19     Temp 12/06/23 1213 99 F (37.2 C)     Temp Source 12/06/23 1213 Oral     SpO2 12/06/23 1213 97 %     Weight 12/06/23 1242 108 kg (238 lb 1.6 oz)     Height 12/06/23 1242 1.905 m (6\' 3" )     Head Circumference --      Peak Flow --      Pain Score 12/06/23 1215 10     Pain Loc --      Pain Education --      Exclude from Growth Chart --     Most recent vital signs: Vitals:   12/06/23 1213  BP: (!) 186/127  Pulse: (!) 124  Resp: 19  Temp: 99 F (37.2 C)  SpO2: 97%     General: Awake, no distress.  CV:  Good peripheral perfusion.  Tenderness over the right chest wall just beneath the right breast, no bruising or bony normalities palpated Resp:  Normal effort.  Clear to auscultation bilaterally Abd:  No distention.  Other:  No calf pain or swelling, normal range of motion of all extremities, no vertebral tenderness palpation.   ED Results / Procedures / Treatments   Labs (all labs ordered are listed, but only abnormal results are displayed) Labs Reviewed  BASIC METABOLIC PANEL WITH GFR - Abnormal; Notable for the following components:      Result Value    Glucose, Bld 150 (*)    All other components within normal limits  TROPONIN I (HIGH SENSITIVITY) - Abnormal; Notable for the following components:   Troponin I (High Sensitivity) 43 (*)    All other components within normal limits  CBC  TROPONIN I (HIGH SENSITIVITY)     EKG  ED ECG REPORT I, Bryson Carbine, the attending physician, personally viewed and interpreted this ECG.  Date: 12/06/2023  Rhythm: Sinus tachycardia QRS Axis: normal Intervals: normal ST/T Wave abnormalities: normal Narrative Interpretation: no evidence of acute ischemia    RADIOLOGY Rib x-ray negative for fracture per radiology    PROCEDURES:  Critical Care performed:   Procedures   MEDICATIONS ORDERED IN ED: Medications  amLODipine  (NORVASC ) tablet 5 mg (5 mg Oral Given 12/06/23 1328)     IMPRESSION / MDM / ASSESSMENT AND PLAN / ED COURSE  I reviewed the triage vital signs and the nursing notes. Patient's presentation is most consistent with acute illness / injury with system symptoms.  Patient presents after a fall injury, he has tenderness over  the right chest wall, suspicious for chest wall contusion versus rib fracture.  Will send for x-ray.  He is markedly hypertensive and has been for years, counseling provided on blood pressure control, will restart his amlodipine .  He also appears to be chronically tachycardic.  Lab work is overall quite reassuring, he does have an elevated troponin which appears to be chronic as well likely related to his chronic hypertension.  PCP referral placed        FINAL CLINICAL IMPRESSION(S) / ED DIAGNOSES   Final diagnoses:  Chest wall contusion, right, initial encounter  Uncontrolled hypertension     Rx / DC Orders   ED Discharge Orders          Ordered    Ambulatory Referral to Primary Care (Establish Care)        12/06/23 1437    amLODipine  (NORVASC ) 5 MG tablet  Daily,   Status:  Discontinued        12/06/23 1438    amLODipine   (NORVASC ) 5 MG tablet  Daily        12/06/23 1438    traMADol  (ULTRAM ) 50 MG tablet  Every 6 hours PRN        12/06/23 1442             Note:  This document was prepared using Dragon voice recognition software and may include unintentional dictation errors.   Bryson Carbine, MD 12/06/23 [phone]
